# Patient Record
Sex: Male | Born: 1980 | Race: White | Hispanic: No | Marital: Single | Smoking: Current every day smoker
Health system: Southern US, Community
[De-identification: ages and names within clinical notes are randomized; demographics above are authoritative.]

## PROBLEM LIST (undated history)

## (undated) DIAGNOSIS — F1910 Other psychoactive substance abuse, uncomplicated: Secondary | ICD-10-CM

## (undated) DIAGNOSIS — R768 Other specified abnormal immunological findings in serum: Secondary | ICD-10-CM

## (undated) DIAGNOSIS — F1110 Opioid abuse, uncomplicated: Secondary | ICD-10-CM

## (undated) DIAGNOSIS — N200 Calculus of kidney: Secondary | ICD-10-CM

## (undated) DIAGNOSIS — Z992 Dependence on renal dialysis: Secondary | ICD-10-CM

---

## 2000-03-25 ENCOUNTER — Encounter: Admission: RE | Admit: 2000-03-25 | Discharge: 2000-03-25 | Payer: Self-pay | Admitting: Internal Medicine

## 2000-09-11 ENCOUNTER — Emergency Department (HOSPITAL_COMMUNITY): Admission: EM | Admit: 2000-09-11 | Discharge: 2000-09-11 | Payer: Self-pay | Admitting: Emergency Medicine

## 2000-09-11 ENCOUNTER — Encounter: Payer: Self-pay | Admitting: Emergency Medicine

## 2007-06-29 ENCOUNTER — Emergency Department (HOSPITAL_COMMUNITY): Admission: EM | Admit: 2007-06-29 | Discharge: 2007-06-29 | Payer: Self-pay | Admitting: Family Medicine

## 2011-04-29 LAB — COMPREHENSIVE METABOLIC PANEL
ALT: 24
AST: 44 — ABNORMAL HIGH
Albumin: 3.6
Alkaline Phosphatase: 65
BUN: 8
Chloride: 103
Potassium: 4.1
Sodium: 136
Total Bilirubin: 0.8
Total Protein: 6

## 2011-04-29 LAB — URINALYSIS, ROUTINE W REFLEX MICROSCOPIC
Bilirubin Urine: NEGATIVE
Glucose, UA: NEGATIVE
Hgb urine dipstick: NEGATIVE
Ketones, ur: NEGATIVE
Nitrite: NEGATIVE
Protein, ur: NEGATIVE
Specific Gravity, Urine: 1.024
Urobilinogen, UA: 1
pH: 6.5

## 2011-04-29 LAB — CBC
HCT: 37.6 — ABNORMAL LOW
Hemoglobin: 13.4
Platelets: 259
RDW: 12.7
WBC: 8.2

## 2011-04-29 LAB — RAPID URINE DRUG SCREEN, HOSP PERFORMED
Amphetamines: NOT DETECTED
Barbiturates: NOT DETECTED
Benzodiazepines: POSITIVE — AB
Cocaine: NOT DETECTED
Opiates: POSITIVE — AB
Tetrahydrocannabinol: POSITIVE — AB

## 2011-04-29 LAB — DIFFERENTIAL
Basophils Absolute: 0
Basophils Relative: 0
Eosinophils Absolute: 0.2
Eosinophils Relative: 2
Monocytes Absolute: 0.8
Monocytes Relative: 10
Neutro Abs: 4.7

## 2011-04-29 LAB — ETHANOL: Alcohol, Ethyl (B): <5

## 2011-10-16 ENCOUNTER — Emergency Department (HOSPITAL_BASED_OUTPATIENT_CLINIC_OR_DEPARTMENT_OTHER)
Admission: EM | Admit: 2011-10-16 | Discharge: 2011-10-16 | Disposition: A | Discharge status: Home / Self Care | Payer: Self-pay | Attending: Emergency Medicine | Admitting: Emergency Medicine

## 2011-10-16 ENCOUNTER — Encounter (HOSPITAL_BASED_OUTPATIENT_CLINIC_OR_DEPARTMENT_OTHER): Payer: Self-pay

## 2011-10-16 ENCOUNTER — Emergency Department (INDEPENDENT_AMBULATORY_CARE_PROVIDER_SITE_OTHER): Payer: Self-pay

## 2011-10-16 DIAGNOSIS — R109 Unspecified abdominal pain: Secondary | ICD-10-CM

## 2011-10-16 DIAGNOSIS — M542 Cervicalgia: Secondary | ICD-10-CM | POA: Insufficient documentation

## 2011-10-16 DIAGNOSIS — M549 Dorsalgia, unspecified: Secondary | ICD-10-CM

## 2011-10-16 DIAGNOSIS — R079 Chest pain, unspecified: Secondary | ICD-10-CM

## 2011-10-16 DIAGNOSIS — M546 Pain in thoracic spine: Secondary | ICD-10-CM | POA: Insufficient documentation

## 2011-10-16 DIAGNOSIS — R0789 Other chest pain: Secondary | ICD-10-CM

## 2011-10-16 DIAGNOSIS — R071 Chest pain on breathing: Secondary | ICD-10-CM | POA: Insufficient documentation

## 2011-10-16 DIAGNOSIS — S161XXA Strain of muscle, fascia and tendon at neck level, initial encounter: Secondary | ICD-10-CM

## 2011-10-16 DIAGNOSIS — S139XXA Sprain of joints and ligaments of unspecified parts of neck, initial encounter: Secondary | ICD-10-CM | POA: Insufficient documentation

## 2011-10-16 HISTORY — DX: Calculus of kidney: N20.0

## 2011-10-16 HISTORY — DX: Dependence on renal dialysis: Z99.2

## 2011-10-16 MED ORDER — IBUPROFEN 800 MG PO TABS
800.0000 mg | ORAL_TABLET | Freq: Once | ORAL | Status: AC
  Administered 2011-10-16: 800 mg via ORAL
  Filled 2011-10-16: qty 1

## 2011-10-16 MED ORDER — IBUPROFEN 600 MG PO TABS: 600.0000 mg | ORAL_TABLET | Freq: Four times a day (QID) | ORAL | Status: AC | PRN

## 2011-10-16 MED ORDER — OXYCODONE-ACETAMINOPHEN 5-325 MG PO TABS
2.0000 | ORAL_TABLET | Freq: Once | ORAL | Status: AC
  Administered 2011-10-16: 2 via ORAL
  Filled 2011-10-16: qty 2

## 2011-10-16 MED ORDER — OXYCODONE-ACETAMINOPHEN 5-325 MG PO TABS: 1.0000 | ORAL_TABLET | ORAL | Status: AC | PRN

## 2011-10-16 NOTE — Discharge Instructions (Signed)
Cervical Sprain A cervical sprain is an injury in the neck in which the ligaments are stretched or torn. The ligaments are the tissues that hold the bones of the neck (vertebrae) in place.Cervical sprains can range from very mild to very severe. Most cervical sprains get better in 1 to 3 weeks, but it depends on the cause and extent of the injury. Severe cervical sprains can cause the neck vertebrae to be unstable. This can lead to damage of the spinal cord and can result in serious nervous system problems. Your caregiver will determine whether your cervical sprain is mild or severe. CAUSES  Severe cervical sprains may be caused by:  Contact sport injuries (football, rugby, wrestling, hockey, auto racing, gymnastics, diving, martial arts, boxing).   Motor vehicle collisions.   Whiplash injuries. This means the neck is forcefully whipped backward and forward.   Falls.  Mild cervical sprains may be caused by:   Awkward positions, such as cradling a telephone between your ear and shoulder.   Sitting in a chair that does not offer proper support.   Working at a poorly designed computer station.   Activities that require looking up or down for long periods of time.  SYMPTOMS   Pain, soreness, stiffness, or a burning sensation in the front, back, or sides of the neck. This discomfort may develop immediately after injury or it may develop slowly and not begin for 24 hours or more after an injury.   Pain or tenderness directly in the middle of the back of the neck.   Shoulder or upper back pain.   Limited ability to move the neck.   Headache.   Dizziness.   Weakness, numbness, or tingling in the hands or arms.   Muscle spasms.   Difficulty swallowing or chewing.   Tenderness and swelling of the neck.  DIAGNOSIS  Most of the time, your caregiver can diagnose this problem by taking your history and doing a physical exam. Your caregiver will ask about any known problems, such as  arthritis in the neck or a previous neck injury. X-rays may be taken to find out if there are any other problems, such as problems with the bones of the neck. However, an X-ray often does not reveal the full extent of a cervical sprain. Other tests such as a computed tomography (CT) scan or magnetic resonance imaging (MRI) may be needed. TREATMENT  Treatment depends on the severity of the cervical sprain. Mild sprains can be treated with rest, keeping the neck in place (immobilization), and pain medicines. Severe cervical sprains need immediate immobilization and an appointment with an orthopedist or neurosurgeon. Several treatment options are available to help with pain, muscle spasms, and other symptoms. Your caregiver may prescribe:  Medicines, such as pain relievers, numbing medicines, or muscle relaxants.   Physical therapy. This can include stretching exercises, strengthening exercises, and posture training. Exercises and improved posture can help stabilize the neck, strengthen muscles, and help stop symptoms from returning.   A neck collar to be worn for short periods of time. Often, these collars are worn for comfort. However, certain collars may be worn to protect the neck and prevent further worsening of a serious cervical sprain.  HOME CARE INSTRUCTIONS   Put ice on the injured area.   Put ice in a plastic bag.   Place a towel between your skin and the bag.   Leave the ice on for 15 to 20 minutes, 3 to 4 times a day.     Only take over-the-counter or prescription medicines for pain, discomfort, or fever as directed by your caregiver.   Keep all follow-up appointments as directed by your caregiver.   Keep all physical therapy appointments as directed by your caregiver.   If a neck collar is prescribed, wear it as directed by your caregiver.   Do not drive while wearing a neck collar.   Make any needed adjustments to your work station to promote good posture.   Avoid positions  and activities that make your symptoms worse.   Warm up and stretch before being active to help prevent problems.  SEEK MEDICAL CARE IF:   Your pain is not controlled with medicine.   You are unable to decrease your pain medicine over time as planned.   Your activity level is not improving as expected.  SEEK IMMEDIATE MEDICAL CARE IF:   You develop any bleeding, stomach upset, or signs of an allergic reaction to your medicine.   Your symptoms get worse.   You develop new, unexplained symptoms.   You have numbness, tingling, weakness, or paralysis in any part of your body.  MAKE SURE YOU:   Understand these instructions.   Will watch your condition.   Will get help right away if you are not doing well or get worse.  Document Released: 05/05/2007 Document Revised: 06/27/2011 Document Reviewed: 04/10/2011 [facility] Patient Information 2012 [place], [place].  You have neck pain, possibly from a cervical strain and/or pinched nerve.   SEEK IMMEDIATE MEDICAL ATTENTION IF: You develop difficulties swallowing or breathing.  You have new or worse numbness, weakness, tingling, or movement problems in your arms or legs.  You develop increasing pain which is uncontrolled with medications.  You have change in bowel or bladder function, or other concerns.
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
## 2013-09-07 ENCOUNTER — Emergency Department (HOSPITAL_COMMUNITY): Payer: Self-pay | Admitting: Anesthesiology

## 2013-09-07 ENCOUNTER — Emergency Department (HOSPITAL_BASED_OUTPATIENT_CLINIC_OR_DEPARTMENT_OTHER): Payer: Self-pay

## 2013-09-07 ENCOUNTER — Encounter (HOSPITAL_COMMUNITY): Payer: Self-pay | Admitting: Anesthesiology

## 2013-09-07 ENCOUNTER — Encounter (HOSPITAL_COMMUNITY)
Admission: EM | Disposition: A | Discharge status: Home / Self Care | Payer: Self-pay | Source: Home / Self Care | Attending: Orthopedic Surgery

## 2013-09-07 ENCOUNTER — Encounter (HOSPITAL_BASED_OUTPATIENT_CLINIC_OR_DEPARTMENT_OTHER): Payer: Self-pay | Admitting: Emergency Medicine

## 2013-09-07 ENCOUNTER — Ambulatory Visit: Admit: 2013-09-07 | Payer: Self-pay | Admitting: Orthopedic Surgery

## 2013-09-07 ENCOUNTER — Inpatient Hospital Stay (HOSPITAL_BASED_OUTPATIENT_CLINIC_OR_DEPARTMENT_OTHER)
Admission: EM | Admit: 2013-09-07 | Discharge: 2013-09-10 | DRG: 908 | Disposition: A | Discharge status: Home / Self Care | Payer: Self-pay | Attending: Orthopedic Surgery | Admitting: Orthopedic Surgery

## 2013-09-07 DIAGNOSIS — F1110 Opioid abuse, uncomplicated: Secondary | ICD-10-CM | POA: Diagnosis present

## 2013-09-07 DIAGNOSIS — T798XXA Other early complications of trauma, initial encounter: Principal | ICD-10-CM | POA: Diagnosis present

## 2013-09-07 DIAGNOSIS — L02414 Cutaneous abscess of left upper limb: Secondary | ICD-10-CM

## 2013-09-07 DIAGNOSIS — IMO0002 Reserved for concepts with insufficient information to code with codable children: Secondary | ICD-10-CM | POA: Diagnosis present

## 2013-09-07 DIAGNOSIS — F1210 Cannabis abuse, uncomplicated: Secondary | ICD-10-CM | POA: Diagnosis present

## 2013-09-07 DIAGNOSIS — F17200 Nicotine dependence, unspecified, uncomplicated: Secondary | ICD-10-CM | POA: Diagnosis present

## 2013-09-07 DIAGNOSIS — Y939 Activity, unspecified: Secondary | ICD-10-CM

## 2013-09-07 HISTORY — PX: I&D EXTREMITY: SHX5045

## 2013-09-07 LAB — COMPREHENSIVE METABOLIC PANEL
ALT: 16 U/L (ref 0–53)
AST: 45 U/L — ABNORMAL HIGH (ref 0–37)
Albumin: 3.9 g/dL (ref 3.5–5.2)
Alkaline Phosphatase: 128 U/L — ABNORMAL HIGH (ref 39–117)
BILIRUBIN TOTAL: 0.5 mg/dL (ref 0.3–1.2)
BUN: 6 mg/dL (ref 6–23)
CALCIUM: 9.6 mg/dL (ref 8.4–10.5)
CO2: 24 meq/L (ref 19–32)
CREATININE: 0.8 mg/dL (ref 0.50–1.35)
Chloride: 101 mEq/L (ref 96–112)
GLUCOSE: 115 mg/dL — AB (ref 70–99)
Potassium: 4.1 mEq/L (ref 3.7–5.3)
SODIUM: 137 meq/L (ref 137–147)
Total Protein: 7.5 g/dL (ref 6.0–8.3)

## 2013-09-07 LAB — CBC WITH DIFFERENTIAL/PLATELET
Basophils Absolute: 0 10*3/uL (ref 0.0–0.1)
Basophils Relative: 0 % (ref 0–1)
EOS PCT: 1 % (ref 0–5)
Eosinophils Absolute: 0.1 10*3/uL (ref 0.0–0.7)
HEMATOCRIT: 41.5 % (ref 39.0–52.0)
Hemoglobin: 14.2 g/dL (ref 13.0–17.0)
LYMPHS ABS: 1.7 10*3/uL (ref 0.7–4.0)
LYMPHS PCT: 13 % (ref 12–46)
MCH: 32.8 pg (ref 26.0–34.0)
MCHC: 34.2 g/dL (ref 30.0–36.0)
MCV: 95.8 fL (ref 78.0–100.0)
MONO ABS: 1.4 10*3/uL — AB (ref 0.1–1.0)
Monocytes Relative: 11 % (ref 3–12)
Neutro Abs: 9.6 10*3/uL — ABNORMAL HIGH (ref 1.7–7.7)
Neutrophils Relative %: 74 % (ref 43–77)
Platelets: 215 10*3/uL (ref 150–400)
RBC: 4.33 MIL/uL (ref 4.22–5.81)
RDW: 14 % (ref 11.5–15.5)
WBC: 12.9 10*3/uL — AB (ref 4.0–10.5)

## 2013-09-07 SURGERY — IRRIGATION AND DEBRIDEMENT EXTREMITY
Anesthesia: General | Site: Arm Lower | Laterality: Left

## 2013-09-07 MED ORDER — PROPOFOL 10 MG/ML IV BOLUS
INTRAVENOUS | Status: DC | PRN
Start: 2013-09-07 — End: 2013-09-07
  Administered 2013-09-07: 200 mg via INTRAVENOUS

## 2013-09-07 MED ORDER — FAMOTIDINE 20 MG PO TABS
20.0000 mg | ORAL_TABLET | Freq: Two times a day (BID) | ORAL | Status: DC | PRN
  Filled 2013-09-07: qty 1

## 2013-09-07 MED ORDER — ONDANSETRON HCL 4 MG/2ML IJ SOLN
INTRAMUSCULAR | Status: DC | PRN
  Administered 2013-09-07: 4 mg via INTRAVENOUS

## 2013-09-07 MED ORDER — SODIUM CHLORIDE 0.9 % IV SOLN
Freq: Once | INTRAVENOUS | Status: AC
  Administered 2013-09-07: 16:00:00 via INTRAVENOUS

## 2013-09-07 MED ORDER — MORPHINE SULFATE 2 MG/ML IJ SOLN
1.0000 mg | INTRAMUSCULAR | Status: DC | PRN
  Administered 2013-09-08 – 2013-09-10 (×7): 1 mg via INTRAVENOUS
  Filled 2013-09-07 (×7): qty 1

## 2013-09-07 MED ORDER — VANCOMYCIN HCL IN DEXTROSE 1-5 GM/200ML-% IV SOLN
1000.0000 mg | Freq: Once | INTRAVENOUS | Status: AC
  Administered 2013-09-07: 1000 mg via INTRAVENOUS
  Filled 2013-09-07: qty 200

## 2013-09-07 MED ORDER — OXYCODONE HCL 5 MG/5ML PO SOLN: 5.0000 mg | Freq: Once | ORAL | Status: AC | PRN

## 2013-09-07 MED ORDER — OXYCODONE HCL 5 MG PO TABS
ORAL_TABLET | ORAL | Status: AC
  Filled 2013-09-07: qty 1

## 2013-09-07 MED ORDER — OXYCODONE HCL 5 MG PO TABS
5.0000 mg | ORAL_TABLET | ORAL | Status: DC | PRN
  Administered 2013-09-08: 10 mg via ORAL
  Administered 2013-09-08: 5 mg via ORAL
  Administered 2013-09-08 – 2013-09-10 (×15): 10 mg via ORAL
  Filled 2013-09-07 (×16): qty 2
  Filled 2013-09-07: qty 1

## 2013-09-07 MED ORDER — HYDROMORPHONE HCL PF 1 MG/ML IJ SOLN
1.0000 mg | Freq: Once | INTRAMUSCULAR | Status: AC
  Administered 2013-09-07: 1 mg via INTRAVENOUS
  Filled 2013-09-07: qty 1

## 2013-09-07 MED ORDER — PIPERACILLIN-TAZOBACTAM 3.375 G IVPB
3.3750 g | Freq: Three times a day (TID) | INTRAVENOUS | Status: DC
  Administered 2013-09-07 – 2013-09-10 (×9): 3.375 g via INTRAVENOUS
  Filled 2013-09-07 (×12): qty 50

## 2013-09-07 MED ORDER — DOCUSATE SODIUM 100 MG PO CAPS
100.0000 mg | ORAL_CAPSULE | Freq: Two times a day (BID) | ORAL | Status: DC
  Administered 2013-09-07 – 2013-09-10 (×6): 100 mg via ORAL
  Filled 2013-09-07 (×7): qty 1

## 2013-09-07 MED ORDER — FENTANYL CITRATE 0.05 MG/ML IJ SOLN
INTRAMUSCULAR | Status: DC | PRN
  Administered 2013-09-07 (×2): 100 ug via INTRAVENOUS
  Administered 2013-09-07: 50 ug via INTRAVENOUS

## 2013-09-07 MED ORDER — MIDAZOLAM HCL 2 MG/2ML IJ SOLN: 0.5000 mg | Freq: Once | INTRAMUSCULAR | Status: DC | PRN

## 2013-09-07 MED ORDER — NICOTINE 14 MG/24HR TD PT24
14.0000 mg | MEDICATED_PATCH | TRANSDERMAL | Status: DC
  Administered 2013-09-07 – 2013-09-09 (×3): 14 mg via TRANSDERMAL
  Filled 2013-09-07 (×4): qty 1

## 2013-09-07 MED ORDER — OXYCODONE HCL 5 MG PO TABS
5.0000 mg | ORAL_TABLET | Freq: Once | ORAL | Status: AC | PRN
  Administered 2013-09-07: 5 mg via ORAL

## 2013-09-07 MED ORDER — LACTATED RINGERS IV SOLN
INTRAVENOUS | Status: DC | PRN
  Administered 2013-09-07 (×2): via INTRAVENOUS

## 2013-09-07 MED ORDER — ONDANSETRON HCL 4 MG PO TABS
4.0000 mg | ORAL_TABLET | Freq: Four times a day (QID) | ORAL | Status: DC | PRN
Start: 2013-09-07 — End: 2013-09-10

## 2013-09-07 MED ORDER — ONDANSETRON HCL 4 MG/2ML IJ SOLN: 4.0000 mg | Freq: Four times a day (QID) | INTRAMUSCULAR | Status: DC | PRN

## 2013-09-07 MED ORDER — PROMETHAZINE HCL 25 MG/ML IJ SOLN: 6.2500 mg | INTRAMUSCULAR | Status: DC | PRN

## 2013-09-07 MED ORDER — HYDROMORPHONE HCL PF 1 MG/ML IJ SOLN: 0.2500 mg | INTRAMUSCULAR | Status: DC | PRN

## 2013-09-07 MED ORDER — VITAMIN C 500 MG PO TABS
1000.0000 mg | ORAL_TABLET | Freq: Every day | ORAL | Status: DC
  Administered 2013-09-07 – 2013-09-10 (×4): 1000 mg via ORAL
  Filled 2013-09-07 (×4): qty 2

## 2013-09-07 MED ORDER — LIDOCAINE HCL (CARDIAC) 20 MG/ML IV SOLN
INTRAVENOUS | Status: DC | PRN
  Administered 2013-09-07: 20 mg via INTRAVENOUS

## 2013-09-07 MED ORDER — ONDANSETRON HCL 4 MG/2ML IJ SOLN
4.0000 mg | Freq: Once | INTRAMUSCULAR | Status: AC
  Administered 2013-09-07: 4 mg via INTRAMUSCULAR
  Filled 2013-09-07: qty 2

## 2013-09-07 MED ORDER — SODIUM CHLORIDE 0.9 % IR SOLN
Status: DC | PRN
  Administered 2013-09-07: 3000 mL

## 2013-09-07 MED ORDER — CEFTRIAXONE SODIUM 1 G IJ SOLR
INTRAMUSCULAR | Status: AC
  Administered 2013-09-07: 1000 mg
  Filled 2013-09-07: qty 10

## 2013-09-07 MED ORDER — MEPERIDINE HCL 25 MG/ML IJ SOLN: 6.2500 mg | INTRAMUSCULAR | Status: DC | PRN

## 2013-09-07 MED ORDER — SUCCINYLCHOLINE CHLORIDE 20 MG/ML IJ SOLN
INTRAMUSCULAR | Status: DC | PRN
  Administered 2013-09-07: 140 mg via INTRAVENOUS

## 2013-09-07 MED ORDER — PROMETHAZINE HCL 25 MG RE SUPP: 12.5000 mg | Freq: Four times a day (QID) | RECTAL | Status: DC | PRN

## 2013-09-07 MED ORDER — MIDAZOLAM HCL 2 MG/2ML IJ SOLN
INTRAMUSCULAR | Status: DC | PRN
  Administered 2013-09-07: 2 mg via INTRAVENOUS

## 2013-09-07 MED ORDER — METHOCARBAMOL 500 MG PO TABS
ORAL_TABLET | ORAL | Status: AC
  Filled 2013-09-07: qty 1

## 2013-09-07 MED ORDER — METHOCARBAMOL 500 MG PO TABS
500.0000 mg | ORAL_TABLET | Freq: Four times a day (QID) | ORAL | Status: DC | PRN
Start: 2013-09-07 — End: 2013-09-10
  Administered 2013-09-07 – 2013-09-10 (×5): 500 mg via ORAL
  Filled 2013-09-07 (×5): qty 1

## 2013-09-07 MED ORDER — VANCOMYCIN HCL IN DEXTROSE 1-5 GM/200ML-% IV SOLN
1000.0000 mg | Freq: Three times a day (TID) | INTRAVENOUS | Status: DC
  Administered 2013-09-08 – 2013-09-10 (×8): 1000 mg via INTRAVENOUS
  Filled 2013-09-07 (×11): qty 200

## 2013-09-07 MED ORDER — DEXTROSE 5 % IV SOLN: 1.0000 g | INTRAVENOUS | Status: DC

## 2013-09-07 MED ORDER — LACTATED RINGERS IV SOLN
INTRAVENOUS | Status: DC
  Administered 2013-09-07 – 2013-09-09 (×2): via INTRAVENOUS

## 2013-09-07 MED ORDER — METHOCARBAMOL 100 MG/ML IJ SOLN
500.0000 mg | Freq: Four times a day (QID) | INTRAMUSCULAR | Status: DC | PRN
  Filled 2013-09-07: qty 5

## 2013-09-07 MED ORDER — HYDROMORPHONE HCL PF 1 MG/ML IJ SOLN
INTRAMUSCULAR | Status: AC
  Filled 2013-09-07: qty 1

## 2013-09-07 SURGICAL SUPPLY — 48 items
BANDAGE CONFORM 2  STR LF (GAUZE/BANDAGES/DRESSINGS) IMPLANT
BANDAGE ELASTIC 4 VELCRO ST LF (GAUZE/BANDAGES/DRESSINGS) ×3 IMPLANT
BANDAGE GAUZE ELAST BULKY 4 IN (GAUZE/BANDAGES/DRESSINGS) ×3 IMPLANT
BNDG GAUZE ELAST 4 BULKY (GAUZE/BANDAGES/DRESSINGS) ×2 IMPLANT
CLOTH BEACON ORANGE TIMEOUT ST (SAFETY) ×3 IMPLANT
CORDS BIPOLAR (ELECTRODE) ×3 IMPLANT
CUFF TOURNIQUET SINGLE 18IN (TOURNIQUET CUFF) ×3 IMPLANT
CUFF TOURNIQUET SINGLE 24IN (TOURNIQUET CUFF) IMPLANT
DRSG ADAPTIC 3X8 NADH LF (GAUZE/BANDAGES/DRESSINGS) ×3 IMPLANT
ELECT REM PT RETURN 9FT ADLT (ELECTROSURGICAL) ×3
ELECTRODE REM PT RTRN 9FT ADLT (ELECTROSURGICAL) IMPLANT
GAUZE PACKING IODOFORM 1 (PACKING) ×2 IMPLANT
GAUZE XEROFORM 1X8 LF (GAUZE/BANDAGES/DRESSINGS) ×3 IMPLANT
GAUZE XEROFORM 5X9 LF (GAUZE/BANDAGES/DRESSINGS) ×2 IMPLANT
GLOVE BIO SURGEON STRL SZ 6.5 (GLOVE) ×2 IMPLANT
GLOVE BIO SURGEONS STRL SZ 6.5 (GLOVE) ×1
GLOVE BIOGEL M STRL SZ7.5 (GLOVE) ×3 IMPLANT
GLOVE BIOGEL PI IND STRL 6.5 (GLOVE) IMPLANT
GLOVE BIOGEL PI INDICATOR 6.5 (GLOVE) ×2
GLOVE SS BIOGEL STRL SZ 8 (GLOVE) ×1 IMPLANT
GLOVE SUPERSENSE BIOGEL SZ 8 (GLOVE) ×4
GOWN SRG XL XLNG 56XLVL 4 (GOWN DISPOSABLE) ×3 IMPLANT
GOWN STRL NON-REIN LRG LVL3 (GOWN DISPOSABLE) ×5 IMPLANT
GOWN STRL NON-REIN XL XLG LVL4 (GOWN DISPOSABLE) ×3
HANDPIECE INTERPULSE COAX TIP (DISPOSABLE)
KIT BASIN OR (CUSTOM PROCEDURE TRAY) ×3 IMPLANT
KIT ROOM TURNOVER OR (KITS) ×3 IMPLANT
MANIFOLD NEPTUNE II (INSTRUMENTS) ×3 IMPLANT
NDL HYPO 25GX1X1/2 BEV (NEEDLE) IMPLANT
NEEDLE HYPO 25GX1X1/2 BEV (NEEDLE) IMPLANT
NS IRRIG 1000ML POUR BTL (IV SOLUTION) ×3 IMPLANT
PACK ORTHO EXTREMITY (CUSTOM PROCEDURE TRAY) ×3 IMPLANT
PAD ARMBOARD 7.5X6 YLW CONV (MISCELLANEOUS) ×6 IMPLANT
PAD CAST 4YDX4 CTTN HI CHSV (CAST SUPPLIES) ×2 IMPLANT
PADDING CAST COTTON 4X4 STRL (CAST SUPPLIES) ×6
SET HNDPC FAN SPRY TIP SCT (DISPOSABLE) IMPLANT
SPONGE GAUZE 4X4 12PLY (GAUZE/BANDAGES/DRESSINGS) ×3 IMPLANT
SPONGE LAP 18X18 X RAY DECT (DISPOSABLE) ×3 IMPLANT
SPONGE LAP 4X18 X RAY DECT (DISPOSABLE) ×1 IMPLANT
SUT SILK 3 0 TIES 10X30 (SUTURE) ×2 IMPLANT
SYR CONTROL 10ML LL (SYRINGE) IMPLANT
TOWEL OR 17X24 6PK STRL BLUE (TOWEL DISPOSABLE) ×3 IMPLANT
TOWEL OR 17X26 10 PK STRL BLUE (TOWEL DISPOSABLE) ×3 IMPLANT
TUBE ANAEROBIC SPECIMEN COL (MISCELLANEOUS) ×2 IMPLANT
TUBE CONNECTING 12'X1/4 (SUCTIONS) ×1
TUBE CONNECTING 12X1/4 (SUCTIONS) ×2 IMPLANT
WATER STERILE IRR 1000ML POUR (IV SOLUTION) ×3 IMPLANT
YANKAUER SUCT BULB TIP NO VENT (SUCTIONS) ×3 IMPLANT

## 2013-09-07 NOTE — Anesthesia Preprocedure Evaluation (Addendum)
Anesthesia Evaluation  Patient identified by MRN, date of birth, ID band Patient awake    Reviewed: Allergy & Precautions, H&P , NPO status , Patient's Chart, lab work & pertinent test results  History of Anesthesia Complications Negative for: history of anesthetic complications  Airway Mallampati: I TM Distance: >3 FB Neck ROM: Full    Dental  (+) Poor Dentition, Chipped, Missing, Dental Advisory Given   Pulmonary Current Smoker,  breath sounds clear to auscultation  Pulmonary exam normal       Cardiovascular negative cardio ROS  Rhythm:Regular Rate:Normal     Neuro/Psych Anxiety    GI/Hepatic negative GI ROS, (+)     substance abuse  alcohol use, cocaine use, marijuana use and IV drug use, Elevated LFTs   Endo/Other    Renal/GU negative Renal ROS     Musculoskeletal   Abdominal   Peds  Hematology negative hematology ROS (+)   Anesthesia Other Findings   Reproductive/Obstetrics                         Anesthesia Physical Anesthesia Plan  ASA: III and emergent  Anesthesia Plan: General   Post-op Pain Management:    Induction: Intravenous and Rapid sequence  Airway Management Planned: Oral ETT  Additional Equipment:   Intra-op Plan:   Post-operative Plan: Extubation in OR  Informed Consent: I have reviewed the patients History and Physical, chart, labs and discussed the procedure including the risks, benefits and alternatives for the proposed anesthesia with the patient or authorized representative who has indicated his/her understanding and acceptance.   Dental advisory given  Plan Discussed with: Surgeon and CRNA  Anesthesia Plan Comments: (Plan routine monitors, GETA)        Anesthesia Quick Evaluation

## 2013-09-07 NOTE — ED Provider Notes (Signed)
CSN: [id number]     Arrival date & time 09/07/13  1301 History   First MD Initiated Contact with Patient 09/07/13 1520     Chief Complaint  Patient presents with  . Abscess     (Consider location/radiation/quality/duration/timing/severity/associated sxs/prior Treatment) Patient is a 33 y.o. male presenting with abscess. The history is provided by the patient. No language interpreter was used.  Abscess Location:  Shoulder/arm Shoulder/arm abscess location:  L arm Size:  10 Abscess quality: induration and redness   Abscess quality: not draining   Progression:  Worsening Chronicity:  New Context: not diabetes   Relieved by:  Nothing Worsened by:  Nothing tried Ineffective treatments:  None tried Associated symptoms: fever   Associated symptoms: no nausea and no vomiting   Pt has a history of heroin abuse but denies injecting in area.   Pt thinks he was bitten by a spider  Past Medical History  Diagnosis Date  . Kidney stones   . Dialysis care    History reviewed. No pertinent past surgical history. No family history on file. History  Substance Use Topics  . Smoking status: Current Every Day Smoker  . Smokeless tobacco: Not on file  . Alcohol Use: Yes     Comment: 6 pack a week.      Review of Systems  Constitutional: Positive for fever.  Gastrointestinal: Negative for nausea and vomiting.  Musculoskeletal: Positive for joint swelling and myalgias.  All other systems reviewed and are negative.      Allergies  Review of patient's allergies indicates no known allergies.  Home Medications  No current outpatient prescriptions on file. BP 101/55  Pulse 116  Temp(Src) 98 F (36.7 C) (Oral)  Resp 20  Ht 5\' 10"  (1.778 m)  Wt 160 lb (72.576 kg)  BMI 22.96 kg/m2  SpO2 99% Physical Exam  Nursing note and vitals reviewed. Constitutional: He is oriented to person, place, and time. He appears well-developed and well-nourished.  HENT:  Head: Normocephalic and  atraumatic.  Right Ear: External ear normal.  Left Ear: External ear normal.  Nose: Nose normal.  Mouth/Throat: Oropharynx is clear and moist.  Eyes: Pupils are equal, round, and reactive to light.  Neck: Normal range of motion.  Cardiovascular: Normal rate and regular rhythm.   Pulmonary/Chest: Effort normal.  Musculoskeletal: He exhibits edema and tenderness.  Swollen red left forearm,  Large abscess,   Red streaks to left axilla   Neurological: He is alert and oriented to person, place, and time. He has normal reflexes.  Skin: There is erythema.  Psychiatric: He has a normal mood and affect.    ED Course  Procedures (including critical care time) Labs Review Labs Reviewed - No data to display Imaging Review No results found.  EKG Interpretation   None       MDM   Final diagnoses:  Abscess of left arm   Pt given iv ns, dilaudid, zofran , rocephin and vancomycin I spoke to Dr. Amanda Peagramig who request pt be transferred to shortstay to go to [place].     Elson AreasLeslie K Sofia, PA-C 09/07/13 1812  Lonia SkinnerLeslie K KansasSofia, PA-C 09/07/13 [phone]

## 2013-09-07 NOTE — Anesthesia Postprocedure Evaluation (Signed)
  Anesthesia Post-op Note  Patient: David [name]  Procedure(s) Performed: Procedure(s): IRRIGATION AND DEBRIDEMENT EXTREMITY (Left)  Patient Location: PACU  Anesthesia Type:General  Level of Consciousness: awake, alert , oriented and patient cooperative  Airway and Oxygen Therapy: Patient Spontanous Breathing and Patient connected to nasal cannula oxygen  Post-op Pain: mild  Post-op Assessment: Post-op Vital signs reviewed, Patient's Cardiovascular Status Stable, Respiratory Function Stable, Patent Airway, No signs of Nausea or vomiting and Pain level controlled  Post-op Vital Signs: Reviewed and stable  Complications: No apparent anesthesia complications

## 2013-09-07 NOTE — Op Note (Signed)
See dictation#[id number] Vladimir CroftsGramig M.D.
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
## 2013-09-07 NOTE — Progress Notes (Addendum)
ANTIBIOTIC CONSULT NOTE - INITIAL  Pharmacy Consult for Vanco/Zosyn Indication: Post-op hand surgery  No Known Allergies  Patient Measurements: Height: 5\' 10"  (177.8 cm) Weight: 160 lb (72.576 kg) IBW/kg (Calculated) : 73 Adjusted Body Weight:    Vital Signs: Temp: 98.9 F (37.2 C) (02/17 2115) Temp src: Oral (02/17 1305) BP: 137/76 mmHg (02/17 2115) Pulse Rate: 93 (02/17 2115) Intake/Output from previous day:   Intake/Output from this shift: Total I/O In: 1150 [P.O.:50; I.V.:1100] Out: -   Labs:  Recent Labs  09/07/13 1545  WBC 12.9*  HGB 14.2  PLT 215  CREATININE 0.80   Estimated Creatinine Clearance: 136.1 ml/min (by C-G formula based on Cr of 0.8). No results found for this basename: VANCOTROUGH, VANCOPEAK, VANCORANDOM, GENTTROUGH, GENTPEAK, GENTRANDOM, TOBRATROUGH, TOBRAPEAK, TOBRARND, AMIKACINPEAK, AMIKACINTROU, AMIKACIN,  in the last 72 hours   Microbiology: No results found for this or any previous visit (from the past 720 hour(s)).  Medical History: Past Medical History  Diagnosis Date  . Kidney stones   . Dialysis care     Medications:  No prescriptions prior to admission   Assessment: L forearm abscess 33 y/o M presents with abscess on L forearm from IV drug use. I&D performed 2/17 PM. Plan to start Vanco/Zosyn post-op  Afebrile. WBC 12.9. Scr 0.8  Goal of Therapy:  Vancomycin trough level 10-15 mcg/ml  Plan:  Vancomycin 1g IV q8hrs (last dose 1730) Trough after 3-5 doses at steady state. Zosyn 3.375g IV q8hr  Misty StanleyRobertson, Nathanyl Andujo Stillinger 09/07/2013,9:44 PM
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
## 2013-09-07 NOTE — Transfer of Care (Signed)
Immediate Anesthesia Transfer of Care Note  Patient: David [name]  Procedure(s) Performed: Procedure(s): IRRIGATION AND DEBRIDEMENT EXTREMITY (Left)  Patient Location: PACU  Anesthesia Type:General  Level of Consciousness: awake, sedated and patient cooperative  Airway & Oxygen Therapy: Patient Spontanous Breathing and Patient connected to nasal cannula oxygen  Post-op Assessment: Report given to PACU RN, Post -op Vital signs reviewed and stable and Patient moving all extremities X 4  Post vital signs: Reviewed and stable  Complications: No apparent anesthesia complications

## 2013-09-07 NOTE — Anesthesia Procedure Notes (Signed)
Procedure Name: Intubation Date/Time: 09/07/2013 7:53 PM Performed by: Molli HazardGORDON, Alonnie Bieker M Pre-anesthesia Checklist: Patient identified, Emergency Drugs available, Suction available and Patient being monitored Patient Re-evaluated:Patient Re-evaluated prior to inductionOxygen Delivery Method: Circle system utilized Preoxygenation: Pre-oxygenation with 100% oxygen Intubation Type: IV induction, Rapid sequence and Cricoid Pressure applied Laryngoscope Size: Miller and 2 Grade View: Grade I Tube type: Oral Tube size: 7.5 mm Number of attempts: 1 Airway Equipment and Method: Stylet Placement Confirmation: ETT inserted through vocal cords under direct vision,  positive ETCO2 and breath sounds checked- equal and bilateral Secured at: 23 cm Tube secured with: Tape Dental Injury: Teeth and Oropharynx as per pre-operative assessment

## 2013-09-08 LAB — CBC WITH DIFFERENTIAL/PLATELET
BASOS ABS: 0 10*3/uL (ref 0.0–0.1)
BASOS PCT: 0 % (ref 0–1)
Eosinophils Absolute: 0.2 10*3/uL (ref 0.0–0.7)
Eosinophils Relative: 2 % (ref 0–5)
HEMATOCRIT: 39.9 % (ref 39.0–52.0)
Hemoglobin: 13.8 g/dL (ref 13.0–17.0)
Lymphocytes Relative: 16 % (ref 12–46)
Lymphs Abs: 1.9 10*3/uL (ref 0.7–4.0)
MCH: 32.9 pg (ref 26.0–34.0)
MCHC: 34.6 g/dL (ref 30.0–36.0)
MCV: 95.2 fL (ref 78.0–100.0)
Monocytes Absolute: 1.5 10*3/uL — ABNORMAL HIGH (ref 0.1–1.0)
Monocytes Relative: 13 % — ABNORMAL HIGH (ref 3–12)
NEUTROS ABS: 8 10*3/uL — AB (ref 1.7–7.7)
Neutrophils Relative %: 69 % (ref 43–77)
Platelets: 203 10*3/uL (ref 150–400)
RBC: 4.19 MIL/uL — ABNORMAL LOW (ref 4.22–5.81)
RDW: 14.2 % (ref 11.5–15.5)
WBC: 11.6 10*3/uL — AB (ref 4.0–10.5)

## 2013-09-08 LAB — HEPATITIS PANEL, ACUTE
HCV Ab: REACTIVE — AB
HEP B C IGM: NONREACTIVE
Hep A IgM: NONREACTIVE
Hepatitis B Surface Ag: NEGATIVE

## 2013-09-08 LAB — HIV ANTIBODY (ROUTINE TESTING W REFLEX): HIV: NONREACTIVE

## 2013-09-08 LAB — BASIC METABOLIC PANEL
BUN: 4 mg/dL — ABNORMAL LOW (ref 6–23)
CHLORIDE: 104 meq/L (ref 96–112)
CO2: 21 mEq/L (ref 19–32)
CREATININE: 0.67 mg/dL (ref 0.50–1.35)
Calcium: 9.2 mg/dL (ref 8.4–10.5)
GFR calc non Af Amer: >90 mL/min (ref >90)
Glucose, Bld: 93 mg/dL (ref 70–99)
Potassium: 3.9 mEq/L (ref 3.7–5.3)
Sodium: 137 mEq/L (ref 137–147)

## 2013-09-08 NOTE — ED Provider Notes (Signed)
33 y.o. Male with abscess left forearm x 2 days. With surrounding erythema almost circumferential of arm and spreading erythema proximally.  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  He does not report chest pain or dyspnea.  No murmur noted on exam.     I performed a history and physical examination of David [name] [name] and discussed his management with Ms. David [name].  I agree with the history, physical, assessment, and plan of care, with the following exceptions: None  I was present for the following procedures: None Time Spent in Critical Care of the patient: None Time spent in discussions with the patient and family: 10  Chayah Mckee Corlis LeakS    [employer] S Maria Coin, MD 09/08/13 1331

## 2013-09-08 NOTE — Progress Notes (Signed)
Patient ID: David [name], male   DOB: 1980/07/24, 33 y.o.   MRN: [id number]  Patient is alert and oriented. He states his pain is well controlled.  Physical exam shows that he is neurovascularly intact. He has less cellulitis proximally. He has no evidence of radial median or ulnar nerve dysfunction.  His white blood cell count is improved.  I discussed with him all issues. He has no signs of DVT no signs of worsening problems. We will plan for mission sling  I have ordered physical therapy for hydrotherapy purposes and wet-to-dry dressing changes        .Marland Kitchen.We have discussed with the patient the issues regarding their infection to the extremity. We will continue antibiotics and await culture results. Often times it will take 3-5 days for cultures to become final. During this time we will typically have patients on intravenous antibiotics until we can find a parenteral route of antibiotic regime specific for the bacteria or organism isolated. We have discussed with the patient the need for daily irrigation and debridement as well as therapy to the area. We have discussed with the patient the necessity of range of motion to the involved joints as discussed today. We have discussed with the patient the unpredictability of infections at times. We'll continue to work towards good pain control and restoration of function. The patient understands the need for meticulous wound care and the necessity of proper followup.  The possible complications of stiffness (loss of motion), resistant infection, possible deep bone infection, possible chronic pain issues, possible need for multiple surgeries and even amputation.  With this in mind the patient understands our goal is to eradicate the infection to quiesence. We will continue to work towards these goals.  David [name] M.D.

## 2013-09-08 NOTE — Progress Notes (Signed)
Physical Therapy Wound Treatment Patient Details  Name: David [name] MRN: [id number] Date of Birth: January 08, 1981  Today's Date: 09/08/2013 Time: 0927-1000 Time Calculation (min): 33 min  Subjective  Subjective: "I've never seen anything like this," pt stated about wound. Patient and Family Stated Goals: Get better. Date of Onset: 09/05/13 Prior Treatments: Surgical I&D  Pain Score: Pain Score: 8 Pt premedicated and repositioned.  Wound Assessment  Wound / Incision (Open or Dehisced) 09/07/13 Other (Comment) Arm Left;Mid red, edematous (Active)  Dressing Type Moist to dry;Gauze (Comment);ABD;Compression wrap 09/08/2013 10:18 AM  Dressing Changed Changed 09/08/2013 10:18 AM  Dressing Status Clean;Dry;Intact 09/08/2013 10:18 AM  Dressing Change Frequency Daily 09/08/2013 10:18 AM  Site / Wound Assessment Red;Bleeding 09/08/2013 10:18 AM  % Wound base Red or Granulating 100% 09/08/2013 10:18 AM  % Wound base Yellow 0% 09/08/2013 10:18 AM  % Wound base Black 0% 09/08/2013 10:18 AM  % Wound base Other (Comment) 0% 09/08/2013 10:18 AM  Peri-wound Assessment Edema;Erythema (blanchable);Induration 09/08/2013 10:18 AM  Wound Length (cm) 4 cm 09/08/2013 10:18 AM  Wound Width (cm) 2 cm 09/08/2013 10:18 AM  Wound Depth (cm) 1 cm 09/08/2013 10:18 AM  Undermining (cm) 2 cm at 6 o'clock 09/08/2013 10:18 AM  Margins Unattached edges (unapproximated) 09/08/2013 10:18 AM  Closure None 09/08/2013 10:18 AM  Drainage Amount Copious 09/08/2013 10:18 AM  Drainage Description Sanguineous 09/08/2013 10:18 AM  Treatment Hydrotherapy (Pulse lavage);Packing (Saline gauze) 09/08/2013 10:18 AM   Hydrotherapy Pulsed lavage therapy - wound location: lt forearm Pulsed Lavage with Suction (psi): 4 psi (4-8) Pulsed Lavage with Suction - Normal Saline Used: 1000 mL Pulsed Lavage Tip: Tip with splash shield   Wound Assessment and Plan  Wound Therapy - Assess/Plan/Recommendations Wound Therapy - Clinical Statement: Pt presents  to hydrotherapy with open arm wound after I&D of abscess. Can benefit from hydrotherapy to cleanse wound and promote healing. Wound Therapy - Functional Problem List: Decr use of lt arm. Factors Delaying/Impairing Wound Healing: Infection - systemic/local;Substance abuse Hydrotherapy Plan: Debridement;Dressing change;Patient/family education;Pulsatile lavage with suction Wound Therapy - Frequency: 6X / week Wound Therapy - Follow Up Recommendations: Other (comment) (per hand surgeon. Teaching pt wet to dry dressings.) Wound Plan: See above  Wound Therapy Goals- Improve the function of patient's integumentary system by progressing the wound(s) through the phases of wound healing (inflammation - proliferation - remodeling) by: Improve Drainage Characteristics: Mod Improve Drainage Characteristics - Progress: Goal set today Patient/Family will be able to : verbalize dressing changes for home. Patient/Family Instruction Goal - Progress: Goal set today Goals/treatment plan/discharge plan were made with and agreed upon by patient/family: Yes Time For Goal Achievement: 7 days Wound Therapy - Potential for Goals: Good  Goals will be updated until maximal potential achieved or discharge criteria met.  Discharge criteria: when goals achieved, discharge from hospital, MD decision/surgical intervention, no progress towards goals, refusal/missing three consecutive treatments without notification or medical reason.  [name] 09/08/2013, 10:28 AM  Suanne Marker PT [phone]

## 2013-09-08 NOTE — Evaluation (Signed)
Physical Therapy Evaluation Patient Details Name: David SchlatterRichard [name] MRN: [id number] DOB: Nov 25, 1980 Today's Date: 09/08/2013 Time: 0900-0910 PT Time Calculation (min): 10 min  PT Assessment / Plan / Recommendation History of Present Illness  pt presents with L UE infections s/p I+D.    Clinical Impression  Pt moving independently without further acute PT needs at this time.  Will defer UE education to Hydrotherapy and OT.  Will sign off.      PT Assessment  Patent does not need any further PT services    Follow Up Recommendations  No PT follow up    Does the patient have the potential to tolerate intense rehabilitation      Barriers to Discharge        Equipment Recommendations  None recommended by PT    Recommendations for Other Services     Frequency      Precautions / Restrictions Precautions Precautions: None Restrictions Weight Bearing Restrictions: No   Pertinent Vitals/Pain L UE 5/10 "when I move it".  Premedicated.        Mobility  Bed Mobility Overal bed mobility: Modified Independent General bed mobility comments: pt needs increased time to perform without using L UE.   Transfers Overall transfer level: Independent Equipment used: None Ambulation/Gait Ambulation/Gait assistance: Independent Ambulation Distance (Feet): 40 Feet Assistive device: None Gait Pattern/deviations: WFL(Within Functional Limits) General Gait Details: pt moving independently.  pt able to stand and don pants without difficulty.      Exercises     PT Diagnosis:    PT Problem List:   PT Treatment Interventions:       PT Goals(Current goals can be found in the care plan section)    Visit Information  Last PT Received On: 09/08/13 Assistance Needed: +1 History of Present Illness: pt presents with L UE infections s/p I+D.         Prior Functioning  Home Living Family/patient expects to be discharged to:: Private residence Living Arrangements: Parent;Other  relatives Prior Function Level of Independence: Independent Communication Communication: No difficulties    Cognition  Cognition Arousal/Alertness: Awake/alert Behavior During Therapy: WFL for tasks assessed/performed Overall Cognitive Status: Within Functional Limits for tasks assessed    Extremity/Trunk Assessment Upper Extremity Assessment Upper Extremity Assessment: Defer to OT evaluation Lower Extremity Assessment Lower Extremity Assessment: Overall WFL for tasks assessed Cervical / Trunk Assessment Cervical / Trunk Assessment: Normal   Balance Balance Overall balance assessment: Independent  End of Session PT - End of Session Activity Tolerance: Patient tolerated treatment well Patient left: in bed;with call bell/phone within reach Nurse Communication: Mobility status  [name], David [name], [place]PT [phone] 09/08/2013, 9:30 AM

## 2013-09-08 NOTE — Op Note (Signed)
NAMMarland Kitchen:  David [name], David [name]            ACCOUNT NO.:  [id number]  MEDICAL RECORD NO.:  [id number]  LOCATION:  [id number]                        FACILITY:  [facility]  PHYSICIAN:  Dionne AnoWilliam M. Meghin Thivierge, M.D.DATE OF BIRTH:  [birth year]  DATE OF PROCEDURE:  09/07/2013 DATE OF DISCHARGE:                              OPERATIVE REPORT   PREOPERATIVE DIAGNOSIS:  Abscess, left forearm with evolving compartment syndrome.  POSTOPERATIVE DIAGNOSIS:  Abscess, left forearm with evolving compartment syndrome.  PROCEDURE: 1. I and D, deep abscess, left forearm. 2. Extensive fasciotomy, left forearm. 3. Resection, 4 cm thrombosed vein, extensive in nature, left forearm. 4. I and D of skin, subcutaneous tissue, muscle, and fascia.  This was     an excisional debridement with knife, blade, curette, and scissors     tip.  SURGEON:  Dionne AnoWilliam M. Amanda PeaGramig, M.D.  ASSISTANT:  None.  COMPLICATIONS:  None.  ANESTHESIA:  General.  TOURNIQUET TIME:  Less than an hour.  DRAINS:  Large amount of iodoform packing was used.  INDICATIONS:  The patient is a 33 year old male referred in from [facility] with an infection and evolving compartment syndrome about the forearm.  The patient is a known IV drug abuser and recently used as the notes reflect.  He has an obvious infection with ascending cellulitis, deep abscess involving compartment syndrome.  He understands risks and benefits of surgery and desires to proceed.  PROCEDURE NOTE:  The patient was seen by myself and anesthesia, taken to the operative suite, underwent a smooth induction of general anesthetic [name].  Once this was done, a sterile field was secured.  Time-out called.  Pre and postop check was complete, and the patient underwent a 2 inch incision about the forearm, immediately encountered purulent material.  This was cultured for aerobic  and anaerobic cultures.  Following this, I very carefully set out to decompress the abscess, it was suctioned, this was done. Following this, it was very clear he would require fasciotomy as the fascia was very thick and inflamed.  At this juncture, I then entered the fascial region and performed an extensive fasciotomy.  Fortunately, the muscle looks fairly healthy.  Excised a large amount of fascia as it looked to be unusually inflamed.  Following extensive fasciotomy and removal of the fascia, I then performed identification of the thrombosed vein.  I have resected a 4 cm area and tied this off with silk ties.  Thus, extensive fasciotomy and fasciectomy were accomplished.  Following this, I resected a 4 cm area of thrombosed vein utilizing silk ties.  There was heroin in the lumen and obvious significant devitalized tissue as well as of a devitalized segment of the vein.  This was necrotic.  Following removal of this, I then performed additional irrigation and debridement of the muscle fascia and associated structures.  The abscess was previously decompressed and at this juncture, I then irrigated with 3 L of saline.  Cysto-tubing was used, saline was copiously irrigated.  Tourniquet was deflated, and the wound was checked multiple times.  Compartments were soft at this junction.  I was pleased, the muscles  did bulge and appeared to be healthy.  I then packed him with iodoform gauze.  After hemostasis was secured, Adaptic, Xeroform and fluffed gauze were then placed.  The patient tolerated this well.  There were no complicating features.  Following this, he was extubated and taken to recovery room.  We will admit him for IV antibiotics, general postop observatory care measures, and await cultures.  I have discussed with him all issues, do's and don'ts etc., and all questions have been encouraged and answered.     Dionne Ano. Amanda Pea, M.D.     [facility]  D:  09/07/2013  T:   09/08/2013  Job:  [id number]

## 2013-09-08 NOTE — Evaluation (Signed)
Occupational Therapy Evaluation Patient Details Name: David SchlatterRichard Bordenave MRN: [id number] DOB: 07/13/1981 Today's Date: 09/08/2013 Time: [id number] OT Time Calculation (min): 10 min  OT Assessment / Plan / Recommendation History of present illness pt presents with L UE infections s/p I+D.     Clinical Impression   Education provided to pt and he performed LUE exercises. Educated on elevating LUE, applying ice, and moving digits/joints.     OT Assessment  Patient does not need any further OT services    Follow Up Recommendations  No OT follow up    Barriers to Discharge      Equipment Recommendations  None recommended by OT    Recommendations for Other Services    Frequency       Precautions / Restrictions Precautions Precautions:  (elevation, edema control) Restrictions Weight Bearing Restrictions: No   Pertinent Vitals/Pain Pain 8/10. Nurse notified. LUE elevated on pillows.     ADL  Upper Body Dressing: Modified independent Where Assessed - Upper Body Dressing: Unsupported sitting Lower Body Dressing: Modified independent Where Assessed - Lower Body Dressing: Unsupported sit to stand Toilet Transfer: Independent Toilet Transfer Method: Sit to Barist[place] Toilet Transfer Equipment: Other (comment) (bed) Transfers/Ambulation Related to ADLs: Independent ADL Comments: Educated to elevate LUE as well as educated on HEP for LUE to prevent other joints from getting stiff. Told pt applying ice helps with edema. Performed approximately 10-15 reps of each exercise on LUE.    OT Diagnosis:    OT Problem List:   OT Treatment Interventions:     OT Goals(Current goals can be found in the care plan section)    Visit Information  Last OT Received On: 09/08/13 Assistance Needed: +1 History of Present Illness: pt presents with L UE infections s/p I+D.         Prior Functioning     Home Living Family/patient expects to be discharged to:: Private residence Living Arrangements:  Parent;Other relatives Prior Function Level of Independence: Independent Communication Communication: No difficulties Dominant Hand: Right         Vision/Perception     Cognition  Cognition Arousal/Alertness: Awake/alert Behavior During Therapy: WFL for tasks assessed/performed Overall Cognitive Status: Within Functional Limits for tasks assessed    Extremity/Trunk Assessment Upper Extremity Assessment Upper Extremity Assessment: LUE deficits/detail LUE Deficits / Details: I & D of forearm Lower Extremity Assessment Lower Extremity Assessment: Defer to PT evaluation     Mobility Bed Mobility Overal bed mobility: Modified Independent Transfers Overall transfer level: Independent Equipment used: None     Exercise General Exercises - Upper Extremity Shoulder Flexion: AROM;Left;10 reps;Seated Shoulder Extension: AROM;Left;10 reps;Seated Elbow Flexion: AROM;Left;10 reps;Seated Elbow Extension: AROM;Left;10 reps;Seated Wrist Flexion: AROM;Left;Seated;15 reps Wrist Extension: AROM;Left;15 reps;Seated Digit Composite Flexion: AROM;Left;10 reps;Seated Composite Extension: AROM;Left;10 reps;Seated Other Exercises Other Exercises: AROM supination/pronation-approximately 10-15 reps while sitting   Balance     End of Session OT - End of Session Activity Tolerance: Patient tolerated treatment well Patient left: in bed;with call bell/phone within reach Nurse Communication: Patient requests pain meds  GO     Earlie RavelingStraub, Jamesa Tedrick L OTR/L [phone] 09/08/2013, 1:59 PM

## 2013-09-08 NOTE — Progress Notes (Signed)
UR complete.  Wilburn Keir RN, MSN 

## 2013-09-08 NOTE — Progress Notes (Signed)
Orthopedic Tech Progress Note Patient Details:  Garth SchlatterRichard Wanzer 1981-05-17 [id number]  Ortho Devices Type of Ortho Device: Arm sling Ortho Device/Splint Location: Kuzma sling and arm sling Ortho Device/Splint Interventions: Application   Cammer, Mickie BailJennifer Carol 09/08/2013, 10:12 AM

## 2013-09-09 LAB — CBC WITH DIFFERENTIAL/PLATELET
Basophils Absolute: 0 10*3/uL (ref 0.0–0.1)
Basophils Relative: 0 % (ref 0–1)
Eosinophils Absolute: 0.3 10*3/uL (ref 0.0–0.7)
Eosinophils Relative: 4 % (ref 0–5)
HCT: 39.4 % (ref 39.0–52.0)
HEMOGLOBIN: 13.4 g/dL (ref 13.0–17.0)
LYMPHS ABS: 1.7 10*3/uL (ref 0.7–4.0)
Lymphocytes Relative: 26 % (ref 12–46)
MCH: 32.6 pg (ref 26.0–34.0)
MCHC: 34 g/dL (ref 30.0–36.0)
MCV: 95.9 fL (ref 78.0–100.0)
MONOS PCT: 16 % — AB (ref 3–12)
Monocytes Absolute: 1.1 10*3/uL — ABNORMAL HIGH (ref 0.1–1.0)
NEUTROS ABS: 3.6 10*3/uL (ref 1.7–7.7)
NEUTROS PCT: 54 % (ref 43–77)
PLATELETS: 220 10*3/uL (ref 150–400)
RBC: 4.11 MIL/uL — AB (ref 4.22–5.81)
RDW: 14.2 % (ref 11.5–15.5)
WBC: 6.7 10*3/uL (ref 4.0–10.5)

## 2013-09-09 LAB — BASIC METABOLIC PANEL
BUN: 4 mg/dL — AB (ref 6–23)
CHLORIDE: 103 meq/L (ref 96–112)
CO2: 24 mEq/L (ref 19–32)
Calcium: 9.3 mg/dL (ref 8.4–10.5)
Creatinine, Ser: 0.62 mg/dL (ref 0.50–1.35)
GFR calc non Af Amer: >90 mL/min (ref >90)
Glucose, Bld: 102 mg/dL — ABNORMAL HIGH (ref 70–99)
Potassium: 3.8 mEq/L (ref 3.7–5.3)
SODIUM: 140 meq/L (ref 137–147)

## 2013-09-09 NOTE — Progress Notes (Signed)
Physical Therapy Wound Treatment Patient Details  Name: David [name] MRN: [id number] Date of Birth: 06/17/1981  Today's Date: 09/09/2013 Time: 0854-0911 Time Calculation (min): 17 min  Subjective     Pain Score: Pain Score: Pt reports pain better today. Premedicated.  Wound Assessment  Wound / Incision (Open or Dehisced) 09/07/13 Other (Comment) Arm Left;Mid red, edematous (Active)  Dressing Type Moist to dry;Gauze (Comment);ABD;Compression wrap 09/09/2013  9:13 AM  Dressing Changed Changed 09/09/2013  9:13 AM  Dressing Status Clean;Dry;Intact 09/09/2013  9:13 AM  Dressing Change Frequency Daily 09/09/2013  9:13 AM  Site / Wound Assessment Red;Bleeding 09/09/2013  9:13 AM  % Wound base Red or Granulating 100% 09/09/2013  9:13 AM  % Wound base Yellow 0% 09/09/2013  9:13 AM  % Wound base Black 0% 09/09/2013  9:13 AM  % Wound base Other (Comment) 0% 09/09/2013  9:13 AM  Peri-wound Assessment Edema;Erythema (blanchable);Induration 09/09/2013  9:13 AM  Wound Length (cm) 4 cm 09/08/2013 10:18 AM  Wound Width (cm) 2 cm 09/08/2013 10:18 AM  Wound Depth (cm) 1 cm 09/08/2013 10:18 AM  Undermining (cm) 2 cm at 6 o'clock 09/08/2013 10:18 AM  Margins Unattached edges (unapproximated) 09/09/2013  9:13 AM  Closure None 09/09/2013  9:13 AM  Drainage Amount Minimal 09/09/2013  9:13 AM  Drainage Description Serosanguineous 09/09/2013  9:13 AM  Treatment Hydrotherapy (Pulse lavage);Packing (Saline gauze) 09/09/2013  9:13 AM   Hydrotherapy Pulsed lavage therapy - wound location: lt forearm Pulsed Lavage with Suction (psi): 4 psi (4-8) Pulsed Lavage with Suction - Normal Saline Used: 1000 mL Pulsed Lavage Tip: Tip with splash shield   Wound Assessment and Plan  Wound Therapy - Assess/Plan/Recommendations Wound Therapy - Clinical Statement: Wound continues to look good. Beefy red granulation tissue present throughout.  Pt verbalized understanding of dressing changes at home. Hydrotherapy Plan:  Debridement;Dressing change;Patient/family education;Pulsatile lavage with suction Wound Therapy - Frequency: 6X / week Wound Therapy - Follow Up Recommendations: Other (comment) (wet to dry dressing changes at home) Wound Plan: See above  Wound Therapy Goals- Improve the function of patient's integumentary system by progressing the wound(s) through the phases of wound healing (inflammation - proliferation - remodeling) by: Improve Drainage Characteristics - Progress: Met Patient/Family Instruction Goal - Progress: Met  Goals will be updated until maximal potential achieved or discharge criteria met.  Discharge criteria: when goals achieved, discharge from hospital, MD decision/surgical intervention, no progress towards goals, refusal/missing three consecutive treatments without notification or medical reason.  GP     MAYCOCK,CARY 09/09/2013, 9:16 AM  Cary Maycock PT [phone]    

## 2013-09-09 NOTE — Progress Notes (Signed)
Patient ID: [name], male   DOB: [birth year], 33 y.o.   MRN: [id number] Patient seen at bedside.  Patient has improved markedly.  The patient is less cellulitis improved range of motion improved sensation and no evidence of compartment syndrome that is present at this juncture.  The patient has cultures which are showing gram-positive cocci. We are awaiting the final cultures.  We will continue vancomycin and Zosyn.  Procedure note: Patient underwent I&D of skin subcutaneous tissue and muscle. This was an excisional debridement of skin subcutaneous and muscle with scissor curet and knife blade without difficulty. He was copiously lavaged throughout the wound. Following this he underwent a wet-to-dry dressing change without difficulty.  He tolerated procedure bedside nicely.  He is doing well in terms of by mouth intake. He feels capable of performing home dressing changes.  I've casting regards to heroin addiction and other issues as there germane to his upper extremity predicament.  The a patient overall looks much improved and is likely to transition to outpatient wound care and observatory measures tomorrow or Saturday.  It was a pleasure to see him   .Marland Kitchen.We have discussed with the patient the issues regarding their infection to the extremity. We will continue antibiotics and await culture results. Often times it will take 3-5 days for cultures to become final. During this time we will typically have patients on intravenous antibiotics until we can find a parenteral route of antibiotic regime specific for the bacteria or organism isolated. We have discussed with the patient the need for daily irrigation and debridement as well as therapy to the area. We have discussed with the patient the necessity of range of motion to the involved joints as discussed today. We have discussed with the patient the unpredictability of infections at times. We'll continue to work towards good pain control  and restoration of function. The patient understands the need for meticulous wound care and the necessity of proper followup.  The possible complications of stiffness (loss of motion), resistant infection, possible deep bone infection, possible chronic pain issues, possible need for multiple surgeries and even amputation.  With this in mind the patient understands our goal is to eradicate the infection to quiesence. We will continue to work towards these goals.  [name] M.D.

## 2013-09-09 NOTE — Progress Notes (Signed)
Pt A&O x4, IV remains intact and infusing, Dsg to Left arm dry and intact, report called off to McKessonSharica RN on 5N at 1030. Transporting pt out on bed with his belongings at side.

## 2013-09-10 ENCOUNTER — Encounter (HOSPITAL_COMMUNITY): Payer: Self-pay | Admitting: Orthopedic Surgery

## 2013-09-10 LAB — VANCOMYCIN, TROUGH: Vancomycin Tr: 17.7 ug/mL (ref 10.0–20.0)

## 2013-09-10 LAB — BASIC METABOLIC PANEL
BUN: 4 mg/dL — ABNORMAL LOW (ref 6–23)
CHLORIDE: 105 meq/L (ref 96–112)
CO2: 26 meq/L (ref 19–32)
CREATININE: 0.82 mg/dL (ref 0.50–1.35)
Calcium: 9.6 mg/dL (ref 8.4–10.5)
GFR calc Af Amer: >90 mL/min (ref >90)
GFR calc non Af Amer: >90 mL/min (ref >90)
GLUCOSE: 117 mg/dL — AB (ref 70–99)
POTASSIUM: 4.2 meq/L (ref 3.7–5.3)
Sodium: 143 mEq/L (ref 137–147)

## 2013-09-10 LAB — CBC WITH DIFFERENTIAL/PLATELET
BASOS PCT: 0 % (ref 0–1)
Basophils Absolute: 0 10*3/uL (ref 0.0–0.1)
Eosinophils Absolute: 0.3 10*3/uL (ref 0.0–0.7)
Eosinophils Relative: 5 % (ref 0–5)
HEMATOCRIT: 40.5 % (ref 39.0–52.0)
HEMOGLOBIN: 13.7 g/dL (ref 13.0–17.0)
LYMPHS PCT: 26 % (ref 12–46)
Lymphs Abs: 1.9 10*3/uL (ref 0.7–4.0)
MCH: 32.5 pg (ref 26.0–34.0)
MCHC: 33.8 g/dL (ref 30.0–36.0)
MCV: 96 fL (ref 78.0–100.0)
MONOS PCT: 14 % — AB (ref 3–12)
Monocytes Absolute: 1.1 10*3/uL — ABNORMAL HIGH (ref 0.1–1.0)
NEUTROS PCT: 55 % (ref 43–77)
Neutro Abs: 4.1 10*3/uL (ref 1.7–7.7)
Platelets: 253 10*3/uL (ref 150–400)
RBC: 4.22 MIL/uL (ref 4.22–5.81)
RDW: 14.2 % (ref 11.5–15.5)
WBC: 7.5 10*3/uL (ref 4.0–10.5)

## 2013-09-10 LAB — CULTURE, ROUTINE-ABSCESS

## 2013-09-10 MED ORDER — OXYCODONE HCL 5 MG PO TABS: 5.0000 mg | ORAL_TABLET | ORAL | Status: DC | PRN

## 2013-09-10 MED ORDER — ASCORBIC ACID 1000 MG PO TABS: 1000.0000 mg | ORAL_TABLET | Freq: Every day | ORAL | Status: DC

## 2013-09-10 MED ORDER — AMOXICILLIN 500 MG PO CAPS: 500.0000 mg | ORAL_CAPSULE | Freq: Three times a day (TID) | ORAL | Status: DC

## 2013-09-10 NOTE — Progress Notes (Signed)
Physical Therapy Wound Treatment Patient Details  Name: David [name] MRN: [id number] Date of Birth: [birth year]  Today's Date: 09/10/2013 Time: 6759-1638 Time Calculation (min): 23 min  Subjective  Subjective: Pt states he may get to go home today.  Pain Score: Pain Score: Pt premedicated  Wound Assessment  Wound / Incision (Open or Dehisced) 09/07/13 Other (Comment) Arm Left;Mid red, edematous (Active)  Dressing Type Moist to dry;Gauze (Comment);ABD;Compression wrap 09/10/2013  8:54 AM  Dressing Changed Changed 09/10/2013  8:54 AM  Dressing Status Clean;Dry;Intact 09/10/2013  8:54 AM  Dressing Change Frequency Daily 09/10/2013  8:54 AM  Site / Wound Assessment Red 09/10/2013  8:54 AM  % Wound base Red or Granulating 100% 09/10/2013  8:54 AM  % Wound base Yellow 0% 09/10/2013  8:54 AM  % Wound base Black 0% 09/10/2013  8:54 AM  % Wound base Other (Comment) 0% 09/10/2013  8:54 AM  Peri-wound Assessment Edema;Erythema (blanchable) 09/10/2013  8:54 AM  Wound Length (cm) 4 cm 09/08/2013 10:18 AM  Wound Width (cm) 2 cm 09/08/2013 10:18 AM  Wound Depth (cm) 1 cm 09/08/2013 10:18 AM  Undermining (cm) 2 cm at 6 o'clock 09/08/2013 10:18 AM  Margins Unattached edges (unapproximated) 09/10/2013  8:54 AM  Closure None 09/10/2013  8:54 AM  Drainage Amount Minimal 09/10/2013  8:54 AM  Drainage Description Serosanguineous 09/10/2013  8:54 AM  Treatment Hydrotherapy (Pulse lavage);Packing (Saline gauze) 09/10/2013  8:54 AM   Hydrotherapy Pulsed lavage therapy - wound location: lt forearm Pulsed Lavage with Suction (psi): 4 psi (4-8) Pulsed Lavage with Suction - Normal Saline Used: 1000 mL Pulsed Lavage Tip: Tip with splash shield   Wound Assessment and Plan  Wound Therapy - Assess/Plan/Recommendations Wound Therapy - Clinical Statement: Wound continues to look good. Beefy red granulation tissue present throughout.  Pt verbalized understanding of dressing changes at home.  Hydrotherapy Plan:  Debridement;Dressing change;Patient/family education;Pulsatile lavage with suction Wound Therapy - Frequency: 6X / week Wound Therapy - Follow Up Recommendations: Other (comment) (dressing changes at home). May benefit from hydrogel at home to keep wound bed moist. Wound Plan: See above  Wound Therapy Goals- Improve the function of patient's integumentary system by progressing the wound(s) through the phases of wound healing (inflammation - proliferation - remodeling) by: Decrease Length/Width/Depth by (cm): Depth by .2 Improve Drainage Characteristics - Progress: Met Patient/Family Instruction Goal - Progress: Met  Goals will be updated until maximal potential achieved or discharge criteria met.  Discharge criteria: when goals achieved, discharge from hospital, MD decision/surgical intervention, no progress towards goals, refusal/missing three consecutive treatments without notification or medical reason.  [name] 09/10/2013, 9:01 AM  David [name] PT [phone]

## 2013-09-10 NOTE — Discharge Instructions (Signed)
Please elevate and move your fingers frequently. Please change her bandage daily with a wet-to-dry dressing change as instructed by the hospital and Dr. Amanda PeaGramig  Call to see Dr. Amanda PeaGramig in 5-6 days  Keep bandage clean and dry.  Call for any problems.  No smoking.  Criteria for driving a car: you should be off your pain medicine for 7-8 hours, able to drive one handed(confident), thinking clearly and feeling able in your judgement to drive. Continue elevation as it will decrease swelling.  If instructed by MD move your fingers within the confines of the bandage/splint.  Use ice if instructed by your MD. Call immediately for any sudden loss of feeling in your hand/arm or change in functional abilities of the extremity.   We recommend that you to take vitamin C 1000 mg a day to promote healing we also recommend that if you require her pain medicine that he take a stool softener to prevent constipation as most pain medicines will have constipation side effects. We recommend either Peri-Colace or Senokot and recommend that you also consider adding MiraLAX to prevent the constipation affects from pain medicine if you are required to use them. These medicines are over the counter and maybe purchased at a local pharmacy.

## 2013-09-10 NOTE — Progress Notes (Signed)
ANTIBIOTIC CONSULT NOTE - FOLLOW UP  Pharmacy Consult for vancomycin Indication: deep L forearm abscess  No Known Allergies  Patient Measurements: Height: 5\' 10"  (177.8 cm) Weight: 160 lb (72.576 kg) IBW/kg (Calculated) : 73  Vital Signs: Temp: 98 F (36.7 C) (02/20 1000) Temp src: Oral (02/20 1000) BP: 120/63 mmHg (02/20 1000) Pulse Rate: 78 (02/20 1000) Intake/Output from previous day: 02/19 0701 - 02/20 0700 In: 3348.7 [P.O.:1710; I.V.:1188.7; IV Piggyback:450] Out: 1050 [Urine:1050] Intake/Output from this shift: Total I/O In: 480 [P.O.:480] Out: 200 [Urine:200]  Labs:  Recent Labs  09/08/13 0645 09/09/13 0740 09/10/13 0425  WBC 11.6* 6.7 7.5  HGB 13.8 13.4 13.7  PLT 203 220 253  CREATININE 0.67 0.62 0.82   Estimated Creatinine Clearance: 132.8 ml/min (by C-G formula based on Cr of 0.82).  Recent Labs  09/10/13 0901  VANCOTROUGH 17.7     Microbiology: Recent Results (from the past 720 hour(s))  AFB CULTURE WITH SMEAR     Status: None   Collection Time    09/07/13  8:08 PM      Result Value Ref Range Status   Specimen Description ABSCESS LEFT ARM   Final   Special Requests NONE   Final   ACID FAST SMEAR     Final   Value: NO ACID FAST BACILLI SEEN     Performed at [facility]   Culture     Final   Value: CULTURE WILL BE EXAMINED FOR 6 WEEKS BEFORE ISSUING A FINAL REPORT     Performed at [facility]   Report Status PENDING   Incomplete  ANAEROBIC CULTURE     Status: None   Collection Time    09/07/13  8:08 PM      Result Value Ref Range Status   Specimen Description ABSCESS LEFT ARM   Final   Special Requests NONE   Final   Gram Stain     Final   Value: RARE WBC PRESENT, PREDOMINANTLY PMN     NO SQUAMOUS EPITHELIAL CELLS SEEN     FEW GRAM POSITIVE COCCI IN PAIRS     Performed at [facility]   Culture     Final   Value: NO ANAEROBES ISOLATED; CULTURE IN PROGRESS FOR 5 DAYS     Performed at [facility]   Report Status PENDING   Incomplete  CULTURE, ROUTINE-ABSCESS     Status: None   Collection Time    09/07/13  8:08 PM      Result Value Ref Range Status   Specimen Description ABSCESS LEFT ARM   Final   Special Requests NONE   Final   Gram Stain     Final   Value: RARE WBC PRESENT, PREDOMINANTLY PMN     NO SQUAMOUS EPITHELIAL CELLS SEEN     FEW GRAM POSITIVE COCCI IN PAIRS     Performed at [facility]   Culture     Final   Value: MODERATE MICROAEROPHILIC STREPTOCOCCI     Performed at [facility]   Report Status 09/10/2013 FINAL   Final  FUNGUS CULTURE W SMEAR     Status: None   Collection Time    09/07/13  8:08 PM      Result Value Ref Range Status   Specimen Description ABSCESS LEFT ARM   Final   Special Requests NONE   Final   Fungal Smear     Final   Value: NO YEAST OR FUNGAL ELEMENTS SEEN  Performed at [facility]   Culture     Final   Value: CULTURE IN PROGRESS FOR FOUR WEEKS     Performed at [facility]   Report Status PENDING   Incomplete    Anti-infectives   Start     Dose/Rate Route Frequency Ordered Stop   09/08/13 0130  vancomycin (VANCOCIN) IVPB 1000 mg/200 mL premix     1,000 mg 200 mL/hr over 60 Minutes Intravenous Every 8 hours 09/07/13 2150     09/07/13 2200  piperacillin-tazobactam (ZOSYN) IVPB 3.375 g     3.375 g 12.5 mL/hr over 240 Minutes Intravenous 3 times per day 09/07/13 2150     09/07/13 1555  cefTRIAXone (ROCEPHIN) 1 G injection    Comments:  Lonna DuvalBooth, Karen   : cabinet override      09/07/13 1555 09/07/13 1602   09/07/13 1545  vancomycin (VANCOCIN) IVPB 1000 mg/200 mL premix     1,000 mg 200 mL/hr over 60 Minutes Intravenous  Once 09/07/13 1530 09/07/13 1831   09/07/13 1545  cefTRIAXone (ROCEPHIN) 1 g in dextrose 5 % 50 mL IVPB  Status:  Discontinued     1 g 100 mL/hr over 30 Minutes Intravenous Every 24 hours 09/07/13 1530 09/07/13 2138      Assessment: 33 y/o male with deep abscess on L forearm from IV  drug use s/p I&D performed on 2/17. He continues on vancomycin with a trough of 17.7 today. I am changing to trough goal to 15-20 mcg/ml for abscess. His trough is therapeutic. Renal function is stable. Abscess culture is growing microaerophilic Strep.  Goal of Therapy:  Vancomycin trough level 15-20 mcg/ml  Plan:  -Continue vancomycin 1000 mg IV q8h -Consider narrowing to ceftriaxone if IV antibiotics are preferred; could use doxycycline or cefuroxime if PO preferred -Monitor renal function and clinical course  [facility] At Bethes[facility] WestJennifer Millsap, 17[address] Rainbow BoulevardPharm.D., BCPS Clinical Pharmacist Pager: [phone] 09/10/2013 10:42 AM

## 2013-09-10 NOTE — Discharge Summary (Addendum)
Physician Discharge Summary  Patient ID: Garth SchlatterRichard Ambrose MRN: [id number] DOB/AGE: 01-17-1981 32 y.o.  Admit date: 09/07/2013 Discharge date: 09/10/2013  Admission Diagnoses: Infection left forearm status post IV drug abuse Discharge Diagnoses: Same Active Problems:   Heroin abuse   Discharged Conditifair240}  Hospital Course: Patient was admitted postop for IV antibiotics and wound care. He had a large abscess left forearm secondary to IV heroin abuse. He grew out strep cocci which was sensitive to penicillin. He will be discharged home today.  His hospital course was unremarkable with excellent improvement in his wound parameters with the proper care. The patient is awake alert and oriented at time of discharge  We will check his Hepatitis C panal and F/u with Dr Ninetta LightsHatcher of ID- see my note and the labs    Consults: None  Significant Diagnostic Studies: labs: See chart  Treatments: surgery: See op note  Discharge Exam: Blood pressure 125/60, pulse 85, temperature 98 F (36.7 C), temperature source Oral, resp. rate 20, height 5\' 10"  (1.778 m), weight 72.576 kg (160 lb), SpO2 100.00%. General appearance: alert, cooperative and appears stated age .Marland Kitchen.The patient is alert and oriented in no acute distress the patient complains of pain in the affected upper extremity.  The patient is noted to have a normal HEENT exam.  Lung fields show equal chest expansion and no shortness of breath  abdomen exam is nontender without distention.  Lower extremity examination does not show any fracture dislocation or blood clot symptoms.  Pelvis is stable neck and back are stable and nontender  Disposition: 01-Home or Self Care     Medication List    Notice   You have not been prescribed any medications.       SignedKaren Chafe: Jonquil Stubbe III,Lafonda Patron M 09/10/2013, 4:06 PM

## 2013-09-10 NOTE — Progress Notes (Signed)
Patient ID: [name], male   DOB: [birth year], 33 y.o.   MRN: [id number] Patient is ready for dc D/w patient the positive hepatits C labs- Hep A and B and HIV are negative Discussed with Dr Ninetta LightsHatcher of ID Will draw Hep C rna qual lab and follow up in the ID clinic in a week Patient is aware David Malone MD

## 2013-09-12 LAB — ANAEROBIC CULTURE

## 2013-09-13 LAB — HEPATITIS C VRS RNA DETECT BY PCR-QUAL: Hepatitis C Vrs RNA by PCR-Qual: POSITIVE — AB

## 2013-10-06 LAB — FUNGUS CULTURE W SMEAR: Fungal Smear: NONE SEEN

## 2013-10-20 LAB — AFB CULTURE WITH SMEAR (NOT AT ARMC): ACID FAST SMEAR: NONE SEEN

## 2016-04-15 ENCOUNTER — Emergency Department (HOSPITAL_BASED_OUTPATIENT_CLINIC_OR_DEPARTMENT_OTHER)
Admission: EM | Admit: 2016-04-15 | Discharge: 2016-04-16 | Disposition: A | Discharge status: Home / Self Care | Payer: Self-pay | Attending: Emergency Medicine | Admitting: Emergency Medicine

## 2016-04-15 DIAGNOSIS — L02416 Cutaneous abscess of left lower limb: Secondary | ICD-10-CM | POA: Insufficient documentation

## 2016-04-15 DIAGNOSIS — L0291 Cutaneous abscess, unspecified: Secondary | ICD-10-CM

## 2016-04-15 DIAGNOSIS — F17200 Nicotine dependence, unspecified, uncomplicated: Secondary | ICD-10-CM | POA: Insufficient documentation

## 2016-04-15 NOTE — ED Triage Notes (Signed)
Pt. Has a red area with a scabbed over area on the L hip.  Pt. Constantly talking in triage and reports he is in pain.  Pt. Reports the area has been there for 2 days,

## 2016-04-16 MED ORDER — LIDOCAINE HCL 2 % IJ SOLN
10.0000 mL | Freq: Once | INTRAMUSCULAR | Status: AC
  Administered 2016-04-16: 200 mg
  Filled 2016-04-16: qty 20

## 2016-04-16 MED ORDER — SULFAMETHOXAZOLE-TRIMETHOPRIM 800-160 MG PO TABS: 1.0000 | ORAL_TABLET | Freq: Two times a day (BID) | ORAL | 0 refills | Status: AC

## 2016-04-16 MED ORDER — CEPHALEXIN 500 MG PO CAPS: 500.0000 mg | ORAL_CAPSULE | Freq: Four times a day (QID) | ORAL | 0 refills | Status: DC

## 2016-04-16 MED ORDER — CEPHALEXIN 250 MG PO CAPS
500.0000 mg | ORAL_CAPSULE | Freq: Once | ORAL | Status: AC
  Administered 2016-04-16: 500 mg via ORAL
  Filled 2016-04-16: qty 2

## 2016-04-16 MED ORDER — SULFAMETHOXAZOLE-TRIMETHOPRIM 800-160 MG PO TABS
1.0000 | ORAL_TABLET | Freq: Once | ORAL | Status: AC
  Administered 2016-04-16: 1 via ORAL
  Filled 2016-04-16: qty 1

## 2016-04-16 NOTE — Discharge Instructions (Signed)
Please read attached information. If you experience any new or worsening signs or symptoms please return to the emergency room for evaluation. Please follow-up with your primary care provider or specialist as discussed. Please use medication prescribed only as directed and discontinue taking if you have any concerning signs or symptoms.   °

## 2016-04-16 NOTE — ED Provider Notes (Signed)
[facility] Note   CSN: [id number] Arrival date & time: 04/15/16  2106     History   Chief Complaint Chief Complaint  Patient presents with  . Abscess    HPI David [name] is a 35 y.o. male.  HPI   35 year old male presents today with abscess to his left hip.  Patient had symptoms started several days ago as a small abscess that spread.  He notes discharge yesterday.  Denies any fever or chills at home.  Patient denies any other areas of involvement today.  Patient has a significant past medical history apparently abuse with upper extremity abscess requiring hospitalization in IV antibiotics. Patient reports he had attempted popping knee abscess and was getting purulent drainage from this.  Past Medical History:  Diagnosis Date  . Dialysis care   . Kidney stones     Patient Active Problem List   Diagnosis Date Noted  . Heroin abuse 09/07/2013    Past Surgical History:  Procedure Laterality Date  . I&D EXTREMITY Left 09/07/2013   Procedure: IRRIGATION AND DEBRIDEMENT EXTREMITY;  Surgeon: Dominica SeverinWilliam Gramig, MD;  Location: [facility] OR;  Service: Orthopedics;  Laterality: Left;     Home Medications    Prior to Admission medications   Medication Sig Start Date End Date Taking? Authorizing Provider  amoxicillin (AMOXIL) 500 MG capsule Take 1 capsule (500 mg total) by mouth 3 (three) times daily. 09/10/13   Dominica SeverinWilliam Gramig, MD  cephALEXin (KEFLEX) 500 MG capsule Take 1 capsule (500 mg total) by mouth 4 (four) times daily. 04/16/16   Eyvonne MechanicJeffrey Hildy Nicholl, PA-C  oxyCODONE (OXY IR/ROXICODONE) 5 MG immediate release tablet Take 1-2 tablets (5-10 mg total) by mouth every 4 (four) hours as needed for moderate pain. 09/10/13   Dominica SeverinWilliam Gramig, MD  sulfamethoxazole-trimethoprim (BACTRIM DS,SEPTRA DS) 800-160 MG tablet Take 1 tablet by mouth 2 (two) times daily. 04/16/16 04/23/16  Eyvonne MechanicJeffrey Mya Suell, PA-C  vitamin C (VITAMIN C) 1000 MG tablet Take 1 tablet (1,000 mg total) by mouth  daily. 09/10/13   Dominica SeverinWilliam Gramig, MD    Family History No family history on file.  Social History Social History  Substance Use Topics  . Smoking status: Current Every Day Smoker  . Smokeless tobacco: Not on file  . Alcohol use Yes     Comment: 6 pack a week.       Allergies   Review of patient's allergies indicates no known allergies.   Review of Systems Review of Systems  All other systems reviewed and are negative.    Physical Exam Updated Vital Signs BP 114/74 (BP Location: Right Arm)   Pulse 104   Temp 98.6 F (37 C) (Oral)   Resp 19   Ht 5\' 10"  (1.778 m)   Wt 68 kg   SpO2 99%   BMI 21.52 kg/m   Physical Exam  Constitutional: He is oriented to person, place, and time. He appears well-developed and well-nourished.  HENT:  Head: Normocephalic and atraumatic.  Eyes: Conjunctivae are normal. Pupils are equal, round, and reactive to light. Right eye exhibits no discharge. Left eye exhibits no discharge. No scleral icterus.  Neck: Normal range of motion. No JVD present. No tracheal deviation present.  Pulmonary/Chest: Effort normal. No stridor.  Neurological: He is alert and oriented to person, place, and time. Coordination normal.  Skin:  2 cm area of induration with surrounding redness noted to left lateral hip full active range of motion of the hip  Psychiatric: He has a normal mood  and affect. His behavior is normal. Judgment and thought content normal.  Nursing note and vitals reviewed.    ED Treatments / Results  Labs (all labs ordered are listed, but only abnormal results are displayed) Labs Reviewed - No data to display  EKG  EKG Interpretation None       Radiology No results found.  Procedures Procedures (including critical care time)  INCISION AND DRAINAGE Performed by: Thermon Leyland Consent: Verbal consent obtained. Risks and benefits: risks, benefits and alternatives were discussed Type: abscess  Body area: Left  hip  Anesthesia: local infiltration  Incision was made with a scalpel.  Local anesthetic: lidocaine 2 % 0 epinephrine  Anesthetic total: 4 ml  Complexity: complex Blunt dissection to break up loculations  Drainage: purulent  Drainage amount: 1 cc  Packing material: 1/4 in iodoform gauze  Patient tolerance: Patient tolerated the procedure well with no immediate complications.    Medications Ordered in ED Medications  lidocaine (XYLOCAINE) 2 % (with pres) injection 200 mg (200 mg Infiltration Given by Other 04/16/16 0106)  sulfamethoxazole-trimethoprim (BACTRIM DS,SEPTRA DS) 800-160 MG per tablet 1 tablet (1 tablet Oral Given 04/16/16 0105)  cephALEXin (KEFLEX) capsule 500 mg (500 mg Oral Given 04/16/16 0105)     Initial Impression / Assessment and Plan / ED Course  I have reviewed the triage vital signs and the nursing notes.  Pertinent labs & imaging results that were available during my care of the patient were reviewed by me and considered in my medical decision making (see chart for details).  Clinical Course      Final Clinical Impressions(s) / ED Diagnoses   Final diagnoses:  Abscess    35 year old male presents today with an abscess to his left hip.  This appears superficial no deep space involvement, minor surrounding cellulitis.  Afebrile and nontoxic.  Patient sleeping in exam bed, waking up anxious.  Abscess drained here in the ED, given dose of antibiotics.  Patient discharged home on antibiotics.  Patient will be staying with his mother who will help administer antibiotics, and given strict preterm percussions, verbalized understanding and agreement baseline and no further questions or concerns time discharge  New Prescriptions New Prescriptions   CEPHALEXIN (KEFLEX) 500 MG CAPSULE    Take 1 capsule (500 mg total) by mouth 4 (four) times daily.   SULFAMETHOXAZOLE-TRIMETHOPRIM (BACTRIM DS,SEPTRA DS) 800-160 MG TABLET    Take 1 tablet by mouth 2 (two) times  daily.     Eyvonne Mechanic, PA-C 04/16/16 0147    Shon Baton, MD 04/16/16 [phone]

## 2016-07-07 ENCOUNTER — Emergency Department (HOSPITAL_BASED_OUTPATIENT_CLINIC_OR_DEPARTMENT_OTHER)
Admission: EM | Admit: 2016-07-07 | Discharge: 2016-07-07 | Disposition: A | Discharge status: Home / Self Care | Payer: Self-pay | Attending: Emergency Medicine | Admitting: Emergency Medicine

## 2016-07-07 ENCOUNTER — Encounter (HOSPITAL_BASED_OUTPATIENT_CLINIC_OR_DEPARTMENT_OTHER): Payer: Self-pay | Admitting: Emergency Medicine

## 2016-07-07 DIAGNOSIS — F17210 Nicotine dependence, cigarettes, uncomplicated: Secondary | ICD-10-CM | POA: Insufficient documentation

## 2016-07-07 DIAGNOSIS — Y9389 Activity, other specified: Secondary | ICD-10-CM | POA: Insufficient documentation

## 2016-07-07 DIAGNOSIS — Y929 Unspecified place or not applicable: Secondary | ICD-10-CM | POA: Insufficient documentation

## 2016-07-07 DIAGNOSIS — Z79899 Other long term (current) drug therapy: Secondary | ICD-10-CM | POA: Insufficient documentation

## 2016-07-07 DIAGNOSIS — Y999 Unspecified external cause status: Secondary | ICD-10-CM | POA: Insufficient documentation

## 2016-07-07 DIAGNOSIS — IMO0002 Reserved for concepts with insufficient information to code with codable children: Secondary | ICD-10-CM

## 2016-07-07 DIAGNOSIS — X58XXXA Exposure to other specified factors, initial encounter: Secondary | ICD-10-CM | POA: Insufficient documentation

## 2016-07-07 DIAGNOSIS — L03012 Cellulitis of left finger: Secondary | ICD-10-CM | POA: Insufficient documentation

## 2016-07-07 MED ORDER — SULFAMETHOXAZOLE-TRIMETHOPRIM 800-160 MG PO TABS
1.0000 | ORAL_TABLET | Freq: Once | ORAL | Status: AC
  Administered 2016-07-07: 1 via ORAL
  Filled 2016-07-07: qty 1

## 2016-07-07 MED ORDER — SULFAMETHOXAZOLE-TRIMETHOPRIM 800-160 MG PO TABS: 1.0000 | ORAL_TABLET | Freq: Two times a day (BID) | ORAL | 0 refills | Status: AC

## 2016-07-07 MED ORDER — IBUPROFEN 400 MG PO TABS
600.0000 mg | ORAL_TABLET | Freq: Once | ORAL | Status: AC
  Administered 2016-07-07: 600 mg via ORAL
  Filled 2016-07-07: qty 1

## 2016-07-07 MED ORDER — BUPIVACAINE HCL (PF) 0.5 % IJ SOLN
20.0000 mL | Freq: Once | INTRAMUSCULAR | Status: AC
Start: 2016-07-07 — End: 2016-07-07
  Administered 2016-07-07: 10 mL
  Filled 2016-07-07: qty 20

## 2016-07-07 MED ORDER — HYDROCODONE-ACETAMINOPHEN 5-325 MG PO TABS: 1.0000 | ORAL_TABLET | ORAL | 0 refills | Status: DC | PRN

## 2016-07-07 NOTE — ED Provider Notes (Signed)
[facility] Provider Note   CSN: [id number] Arrival date & time: 07/07/16  1731  By signing my name below, I, David [name], attest that this documentation has been prepared under the direction and in the presence of Jacalyn Lefevre, MD.  Electronically Signed: Octavia [name], ED Scribe. 07/07/16. 5:56 PM.   History   Chief Complaint Chief Complaint  Patient presents with  . forgein body in thumb    HPI Comments: David [name] is a 35 y.o. male who presents to the Emergency Department presenting with a moderate, gradual worsening, foreign body in his left thumb x 5 days. Pt has associated redness, swelling and pain to the area. pt states he was picked up a glass mirror and it broke in his hand. He states having glass shards in his thumb. Pt reports trying to remove the glass with tweezers, and had mild purulent drainage from the area. Pt denies having any allergies. There are no other complaints.   The history is provided by the patient. No language interpreter was used.    Past Medical History:  Diagnosis Date  . Dialysis care    no longer in dialysis  . Kidney stones     Patient Active Problem List   Diagnosis Date Noted  . Heroin abuse 09/07/2013    Past Surgical History:  Procedure Laterality Date  . I&D EXTREMITY Left 09/07/2013   Procedure: IRRIGATION AND DEBRIDEMENT EXTREMITY;  Surgeon: Dominica Severin, MD;  Location: [facility] OR;  Service: Orthopedics;  Laterality: Left;       Home Medications    Prior to Admission medications   Medication Sig Start Date End Date Taking? Authorizing Provider  amoxicillin (AMOXIL) 500 MG capsule Take 1 capsule (500 mg total) by mouth 3 (three) times daily. 09/10/13   Dominica Severin, MD  cephALEXin (KEFLEX) 500 MG capsule Take 1 capsule (500 mg total) by mouth 4 (four) times daily. 04/16/16   Eyvonne Mechanic, PA-C  HYDROcodone-acetaminophen (NORCO/VICODIN) 5-325 MG tablet Take 1 tablet by mouth every 4 (four) hours as  needed. 07/07/16   Jacalyn Lefevre, MD  oxyCODONE (OXY IR/ROXICODONE) 5 MG immediate release tablet Take 1-2 tablets (5-10 mg total) by mouth every 4 (four) hours as needed for moderate pain. 09/10/13   Dominica Severin, MD  sulfamethoxazole-trimethoprim (BACTRIM DS,SEPTRA DS) 800-160 MG tablet Take 1 tablet by mouth 2 (two) times daily. 07/07/16 07/14/16  Jacalyn Lefevre, MD  vitamin C (VITAMIN C) 1000 MG tablet Take 1 tablet (1,000 mg total) by mouth daily. 09/10/13   Dominica Severin, MD    Family History History reviewed. No pertinent family history.  Social History Social History  Substance Use Topics  . Smoking status: Current Every Day Smoker    Packs/day: 1.00    Types: Cigarettes  . Smokeless tobacco: Never Used  . Alcohol use Yes     Comment: denies 07/07/16     Allergies   Patient has no known allergies.   Review of Systems Review of Systems  A complete 10 system review of systems was obtained and all systems are negative except as noted in the HPI and PMH.    Physical Exam Updated Vital Signs BP 138/86 (BP Location: Right Arm)   Pulse 119   Temp 98.8 F (37.1 C) (Oral)   Resp 24   Ht 5\' 10"  (1.778 m)   Wt 145 lb (65.8 kg)   SpO2 100%   BMI 20.81 kg/m   Physical Exam  Constitutional: He is oriented to person, place, and  time. He appears well-developed and well-nourished.  HENT:  Head: Normocephalic and atraumatic.  Eyes: EOM are normal.  Neck: Normal range of motion.  Cardiovascular: Normal rate, regular rhythm, normal heart sounds and intact distal pulses.   Pulmonary/Chest: Effort normal and breath sounds normal. No respiratory distress.  Abdominal: Soft. He exhibits no distension. There is no tenderness.  Musculoskeletal: Normal range of motion. He exhibits edema and tenderness.  Left thumb w/erythema, swelling, and tenderness at distal end  Neurological: He is alert and oriented to person, place, and time.  Skin: Skin is warm and dry.  Psychiatric:  Judgment normal. His mood appears anxious.  Nursing note and vitals reviewed.    ED Treatments / Results   DIAGNOSTIC STUDIES: Oxygen Saturation is 100% on room air, normal by my interpretation.  COORDINATION OF CARE: 5:55 PM Discussed treatment plan with pt at bedside and pt agreed to plan.   Labs (all labs ordered are listed, but only abnormal results are displayed) Labs Reviewed - No data to display  EKG  EKG Interpretation None       Radiology No results found.  Procedures .Nerve Block Date/Time: 07/07/2016 6:10 PM Performed by: Jacalyn LefevreHAVILAND, Remona Boom Authorized by: Jacalyn LefevreHAVILAND, Daaiyah Baumert   Consent:    Consent obtained:  Verbal   Consent given by:  Patient   Risks discussed:  Allergic reaction and infection   Alternatives discussed:  No treatment Indications:    Indications:  Pain relief and procedural anesthesia Location:    Body area:  Upper extremity   Laterality:  Left Pre-procedure details:    Skin preparation:  2% chlorhexidine Procedure details (see MAR for exact dosages):    Block needle gauge:  25 G   Anesthetic injected:  Bupivacaine 0.5% w/o epi   Steroid injected:  None   Additive injected:  None   Injection procedure:  Anatomic landmarks identified, incremental injection, negative aspiration for blood, introduced needle and anatomic landmarks palpated   Paresthesia:  None Post-procedure details:    Dressing:  None   Outcome:  Anesthesia achieved   Patient tolerance of procedure:  Tolerated well, no immediate complications  .Marland Kitchen.Incision and Drainage Date/Time: 07/07/2016 6:30 PM Performed by: Jacalyn LefevreHAVILAND, Quintavia Rogstad Authorized by: Jacalyn LefevreHAVILAND, Zell Hylton   Consent:    Consent obtained:  Verbal   Risks discussed:  Bleeding, pain, incomplete drainage and infection   Alternatives discussed:  No treatment Location:    Type:  Abscess   Size:  3 cm   Location:  Upper extremity   Upper extremity location:  Finger   Finger location:  L thumb Pre-procedure details:     Skin preparation:  Betadine Anesthesia (see MAR for exact dosages):    Anesthesia method:  Nerve block Procedure type:    Complexity:  Simple Procedure details:    Incision types:  Stab incision   Scalpel blade:  11   Wound management:  Probed and deloculated   Drainage:  Purulent   Drainage amount:  Moderate   Wound treatment:  Wound left open   Packing materials:  None Post-procedure details:    Patient tolerance of procedure:  Tolerated well, no immediate complications   (including critical care time)  I do not feel any glass in skin.  I told patient that I may not be able to feel small pieces of glass.  He is told they usually work themselves out.   Medications Ordered in ED Medications  bupivacaine (MARCAINE) 0.5 % injection 20 mL (10 mLs Infiltration Given 07/07/16 1808)  sulfamethoxazole-trimethoprim (  BACTRIM DS,SEPTRA DS) 800-160 MG per tablet 1 tablet (1 tablet Oral Given 07/07/16 1807)  ibuprofen (ADVIL,MOTRIN) tablet 600 mg (600 mg Oral Given 07/07/16 1807)     Initial Impression / Assessment and Plan / ED Course  I have reviewed the triage vital signs and the nursing notes.  Pertinent labs & imaging results that were available during my care of the patient were reviewed by me and considered in my medical decision making (see chart for details).  Clinical Course     Pt given the number to Dr. Debby Budoley's office to f/u.  He knows to return if worse.  Final Clinical Impressions(s) / ED Diagnoses   Final diagnoses:  Felon   I personally performed the services described in this documentation, which was scribed in my presence. The recorded information has been reviewed and is accurate.  New Prescriptions New Prescriptions   HYDROCODONE-ACETAMINOPHEN (NORCO/VICODIN) 5-325 MG TABLET    Take 1 tablet by mouth every 4 (four) hours as needed.   SULFAMETHOXAZOLE-TRIMETHOPRIM (BACTRIM DS,SEPTRA DS) 800-160 MG TABLET    Take 1 tablet by mouth 2 (two) times daily.       Jacalyn LefevreJulie Stevie Charter, MD 07/07/16 [phone]

## 2016-07-07 NOTE — ED Triage Notes (Signed)
Patient reports he has glass shards in his thumb, states a mirror broke and when he was picking it up the glass got into his thumb on Wednesday . Thumb is red and swollen. Patient c/o pain is throbbing. Patient has attempted to remove the glass from his thumb with a straight pin.    Patient temp unable to obtain in triage as he was drinking soda, will need recheck.

## 2016-12-05 ENCOUNTER — Emergency Department (HOSPITAL_BASED_OUTPATIENT_CLINIC_OR_DEPARTMENT_OTHER): Payer: Self-pay

## 2016-12-05 ENCOUNTER — Inpatient Hospital Stay (HOSPITAL_COMMUNITY)
Admission: EM | Admit: 2016-12-05 | Discharge: 2016-12-09 | DRG: 493 | Disposition: A | Discharge status: Home / Self Care | Payer: Self-pay | Attending: Internal Medicine | Admitting: Internal Medicine

## 2016-12-05 ENCOUNTER — Emergency Department (HOSPITAL_BASED_OUTPATIENT_CLINIC_OR_DEPARTMENT_OTHER)
Admission: EM | Admit: 2016-12-05 | Discharge: 2016-12-05 | Disposition: A | Discharge status: Home / Self Care | Payer: Self-pay | Attending: Emergency Medicine | Admitting: Emergency Medicine

## 2016-12-05 ENCOUNTER — Encounter (HOSPITAL_BASED_OUTPATIENT_CLINIC_OR_DEPARTMENT_OTHER): Payer: Self-pay | Admitting: Emergency Medicine

## 2016-12-05 ENCOUNTER — Encounter (HOSPITAL_COMMUNITY): Payer: Self-pay

## 2016-12-05 DIAGNOSIS — M25471 Effusion, right ankle: Secondary | ICD-10-CM

## 2016-12-05 DIAGNOSIS — F17210 Nicotine dependence, cigarettes, uncomplicated: Secondary | ICD-10-CM | POA: Insufficient documentation

## 2016-12-05 DIAGNOSIS — F419 Anxiety disorder, unspecified: Secondary | ICD-10-CM | POA: Diagnosis present

## 2016-12-05 DIAGNOSIS — R451 Restlessness and agitation: Secondary | ICD-10-CM | POA: Diagnosis not present

## 2016-12-05 DIAGNOSIS — F1910 Other psychoactive substance abuse, uncomplicated: Secondary | ICD-10-CM | POA: Diagnosis present

## 2016-12-05 DIAGNOSIS — F1110 Opioid abuse, uncomplicated: Secondary | ICD-10-CM | POA: Diagnosis present

## 2016-12-05 DIAGNOSIS — M00871 Arthritis due to other bacteria, right ankle and foot: Principal | ICD-10-CM | POA: Diagnosis present

## 2016-12-05 DIAGNOSIS — M65871 Other synovitis and tenosynovitis, right ankle and foot: Secondary | ICD-10-CM | POA: Diagnosis present

## 2016-12-05 DIAGNOSIS — D649 Anemia, unspecified: Secondary | ICD-10-CM

## 2016-12-05 DIAGNOSIS — M25571 Pain in right ankle and joints of right foot: Secondary | ICD-10-CM | POA: Insufficient documentation

## 2016-12-05 DIAGNOSIS — F1510 Other stimulant abuse, uncomplicated: Secondary | ICD-10-CM | POA: Diagnosis present

## 2016-12-05 DIAGNOSIS — M659 Synovitis and tenosynovitis, unspecified: Secondary | ICD-10-CM

## 2016-12-05 DIAGNOSIS — B1910 Unspecified viral hepatitis B without hepatic coma: Secondary | ICD-10-CM | POA: Diagnosis present

## 2016-12-05 DIAGNOSIS — F1123 Opioid dependence with withdrawal: Secondary | ICD-10-CM | POA: Diagnosis not present

## 2016-12-05 DIAGNOSIS — M009 Pyogenic arthritis, unspecified: Secondary | ICD-10-CM | POA: Diagnosis present

## 2016-12-05 DIAGNOSIS — Z720 Tobacco use: Secondary | ICD-10-CM

## 2016-12-05 DIAGNOSIS — B1920 Unspecified viral hepatitis C without hepatic coma: Secondary | ICD-10-CM | POA: Diagnosis present

## 2016-12-05 DIAGNOSIS — L03115 Cellulitis of right lower limb: Secondary | ICD-10-CM

## 2016-12-05 HISTORY — DX: Other psychoactive substance abuse, uncomplicated: F19.10

## 2016-12-05 HISTORY — DX: Opioid abuse, uncomplicated: F11.10

## 2016-12-05 HISTORY — DX: Other specified abnormal immunological findings in serum: R76.8

## 2016-12-05 LAB — CBC WITH DIFFERENTIAL/PLATELET
Basophils Absolute: 0 10*3/uL (ref 0.0–0.1)
Basophils Relative: 0 %
Eosinophils Absolute: 0.4 10*3/uL (ref 0.0–0.7)
Eosinophils Relative: 5 %
HCT: 33.6 % — ABNORMAL LOW (ref 39.0–52.0)
Hemoglobin: 11.1 g/dL — ABNORMAL LOW (ref 13.0–17.0)
LYMPHS ABS: 1.9 10*3/uL (ref 0.7–4.0)
LYMPHS PCT: 28 %
MCH: 30.5 pg (ref 26.0–34.0)
MCHC: 33 g/dL (ref 30.0–36.0)
MCV: 92.3 fL (ref 78.0–100.0)
MONOS PCT: 17 %
Monocytes Absolute: 1.2 10*3/uL — ABNORMAL HIGH (ref 0.1–1.0)
NEUTROS PCT: 50 %
Neutro Abs: 3.4 10*3/uL (ref 1.7–7.7)
Platelets: 252 10*3/uL (ref 150–400)
RBC: 3.64 MIL/uL — ABNORMAL LOW (ref 4.22–5.81)
RDW: 12.5 % (ref 11.5–15.5)
WBC: 6.8 10*3/uL (ref 4.0–10.5)

## 2016-12-05 LAB — C-REACTIVE PROTEIN: CRP: 5.1 mg/dL — ABNORMAL HIGH (ref <1.0)

## 2016-12-05 LAB — SEDIMENTATION RATE: Sed Rate: 54 mm/hr — ABNORMAL HIGH (ref 0–16)

## 2016-12-05 MED ORDER — MORPHINE SULFATE (PF) 4 MG/ML IV SOLN
4.0000 mg | Freq: Once | INTRAVENOUS | Status: AC
  Administered 2016-12-05: 4 mg via INTRAVENOUS
  Filled 2016-12-05: qty 1

## 2016-12-05 NOTE — ED Provider Notes (Signed)
[facility]   CSN: [id number] Arrival date & time: 12/05/16  2047  By signing my name below, I, Nelwyn Salisbury, attest that this documentation has been prepared under the direction and in the presence of Dione Booze, MD . Electronically Signed: Nelwyn Salisbury, Scribe. 12/05/2016. 11:34 PM.  History   Chief Complaint Chief Complaint  Patient presents with  . Joint Swelling   The history is provided by the patient. No language interpreter was used.    HPI Comments:  David [name] is a 36 y.o. male with pmhx of Hepatitis C and Heroin abuse who presents to the Emergency Department complaining of constant, worsening right ankle swelling onset 2 weeks. He reports associated pain to the area described as an 8/10. No mechanism of injury indicated. Pt was seen at [facility] for similar symptoms and told to come to [facility] hospital for an MRI. He states that first went to bed and did not come to the hospital straight away. Denies any fevers, chills or diaphoresis. Pt is an active drug abuser and confirms frequent use of heroine and "ice" with his last user yesterday.   Past Medical History:  Diagnosis Date  . Dialysis care    no longer in dialysis  . Hepatitis C antibody positive in blood   . Heroin abuse   . Kidney stones   . Polysubstance abuse     Patient Active Problem List   Diagnosis Date Noted  . Heroin abuse 09/07/2013    Past Surgical History:  Procedure Laterality Date  . I&D EXTREMITY Left 09/07/2013   Procedure: IRRIGATION AND DEBRIDEMENT EXTREMITY;  Surgeon: Dominica Severin, MD;  Location: [facility] OR;  Service: Orthopedics;  Laterality: Left;       Home Medications    Prior to Admission medications   Not on File    Family History No family history on file.  Social History Social History  Substance Use Topics  . Smoking status: Current Every Day Smoker    Packs/day: 1.00    Types: Cigarettes  . Smokeless tobacco: Never Used  . Alcohol use Yes   Comment: denies 07/07/16     Allergies   Patient has no known allergies.   Review of Systems Review of Systems  Constitutional: Negative for chills, diaphoresis and fever.  Musculoskeletal: Positive for arthralgias and joint swelling.  All other systems reviewed and are negative.    Physical Exam Updated Vital Signs BP 113/79   Pulse 94   Temp 98.2 F (36.8 C) (Oral)   Resp 18   SpO2 100%   Physical Exam  Constitutional: He is oriented to person, place, and time. He appears well-developed and well-nourished.  HENT:  Head: Normocephalic and atraumatic.  Eyes: EOM are normal. Pupils are equal, round, and reactive to light.  Neck: Normal range of motion. Neck supple. No JVD present.  Cardiovascular: Normal rate, regular rhythm and normal heart sounds.   No murmur heard. Pulmonary/Chest: Effort normal and breath sounds normal. He has no wheezes. He has no rales. He exhibits no tenderness.  Abdominal: Soft. Bowel sounds are normal. He exhibits no distension and no mass. There is no tenderness.  Musculoskeletal: Normal range of motion. He exhibits edema.  Right ankle has generalized soft tissue swelling with mild erythema and warmth. Pain on passive ROM.   Lymphadenopathy:    He has no cervical adenopathy.  Neurological: He is alert and oriented to person, place, and time. No cranial nerve deficit. He exhibits normal muscle tone. Coordination normal.  Skin: Skin is warm and dry. No rash noted.  Psychiatric: He has a normal mood and affect. His behavior is normal. Judgment and thought content normal.  Nursing note and vitals reviewed.    ED Treatments / Results  DIAGNOSTIC STUDIES:  Oxygen Saturation is 100% on RA, normal by my interpretation.    COORDINATION OF CARE:  11:37 PM Discussed treatment plan with pt at bedside which includes MRI imaging and pt agreed to plan.  Labs (all labs ordered are listed, but only abnormal results are displayed) Labs Reviewed  CBC  WITH DIFFERENTIAL/PLATELET - Abnormal; Notable for the following:       Result Value   RBC 3.95 (*)    Hemoglobin 11.8 (*)    HCT 35.3 (*)    All other components within normal limits  BASIC METABOLIC PANEL - Abnormal; Notable for the following:    BUN <5 (*)    Creatinine, Ser 0.59 (*)    All other components within normal limits  I-STAT CG4 LACTIC ACID, ED    Radiology Dg Ankle Complete Right  Result Date: 12/05/2016 CLINICAL DATA:  Right ankle pain for 2 weeks. Swelling. No known injury. EXAM: RIGHT ANKLE - COMPLETE 3+ VIEW COMPARISON:  None. FINDINGS: There is no evidence of fracture or dislocation. There is no evidence of arthropathy or other focal bone abnormality. There is a small tibiotalar joint effusion. Mild lateral soft tissue edema. IMPRESSION: Soft tissue edema and small joint effusion.  No osseous abnormality. Electronically Signed   By: Rubye OaksMelanie  Ehinger M.D.   On: 12/05/2016 03:16   Mr Ankle Right W Wo Contrast  Result Date: 12/06/2016 CLINICAL DATA:  Hepatitis-C and heroin abuser complaining of worsening right ankle swelling and pain onset 2 weeks ago. EXAM: MRI OF THE RIGHT ANKLE WITHOUT AND WITH CONTRAST TECHNIQUE: Multiplanar, multisequence MR imaging of the ankle was performed before and after the administration of intravenous contrast. CONTRAST:  15 cc MultiHance IV COMPARISON:  Radiographs from the same day FINDINGS: TENDONS Peroneal: Intact peroneus longus and peroneus brevis tendons. Posteromedial: Intact tibialis posterior, flexor hallucis longus and flexor digitorum longus tendons. Anterior: Intact tibialis anterior, extensor hallucis longus and extensor digitorum longus tendons. Achilles: Intact. Plantar Fascia: Intact. LIGAMENTS Lateral: Intact. Medial: Intact. CARTILAGE Ankle Joint: Small to moderate enhancing tibiotalar and small posterior subtalar joint effusion may be secondary to a synovitis. A septic arthritis is not entirely excluded though preservation of the  cartilage within the ankle and subtalar joints would make this less likely. Subtalar Joints/Sinus Tarsi: Intact with small posterior subtalar joint effusion. Bones: Minimal reactive edema about the tibiotalar and subtalar joints. Other: Soft tissue edema about the malleoli and hindfoot. IMPRESSION: IMPRESSION Soft tissue edema consistent with cellulitis. Enhancing ankle and subtalar joint effusions are likely related to a synovitis and less likely septic arthritis given the preservation of the cartilage within the joint spaces. No drainable fluid collections to suggest abscess. No evidence of a pyomyositis. Electronically Signed   By: Tollie Ethavid  Kwon M.D.   On: 12/06/2016 03:30    Procedures Procedures (including critical care time)  Medications Ordered in ED Medications  gadobenate dimeglumine (MULTIHANCE) injection 15 mL (not administered)  vancomycin (VANCOCIN) IVPB 1000 mg/200 mL premix (1,000 mg Intravenous New Bag/Given 12/06/16 0354)  HYDROcodone-acetaminophen (NORCO/VICODIN) 5-325 MG per tablet 1-2 tablet (2 tablets Oral Given 12/06/16 0356)  vancomycin (VANCOCIN) IVPB 1000 mg/200 mL premix (not administered)  morphine 4 MG/ML injection 4 mg (4 mg Intravenous Given 12/05/16 2357)  Initial Impression / Assessment and Plan / ED Course  I have reviewed the triage vital signs and the nursing notes.  Pertinent labs & imaging results that were available during my care of the patient were reviewed by me and considered in my medical decision making (see chart for details).  Pain and swelling in the right ankle in patient with history of IV drug abuse for a worrisome for septic joint. Old records are reviewed, and he was in the ED and [facility] earlier today and was referred here for MRI of the ankle to rule out septic joint. Patient apparently was not aware that he should, here immediately, so there is delay of approximately 16 hours. MRI of the ankle is ordered to evaluate for  possible septic joint.  MRI is consistent with cellulitis but no clear evidence of septic joint. He is started on vancomycin for infection. With [facility] consultation will been needed. Case is discussed with Dr. Julian Reil of [facility] agrees to admit the patient.  Final Clinical Impressions(s) / ED Diagnoses   Final diagnoses:  Pain and swelling of right ankle  Cellulitis of right ankle  Synovitis of right ankle  Effusion, right ankle  Normochromic normocytic anemia    New Prescriptions New Prescriptions   No medications on file   I personally performed the services described in this documentation, which was scribed in my presence. The recorded information has been reviewed and is accurate.       Dione Booze, MD 12/06/16 [phone]

## 2016-12-05 NOTE — ED Notes (Signed)
ED Provider at bedside. 

## 2016-12-05 NOTE — Discharge Instructions (Signed)
[facility]. Tell them you were transferred from [facility] to have an MRI of your right ankle. If the MRI shows evidence of infection in the joint you need to have an orthopedist evaluate you.

## 2016-12-05 NOTE — ED Triage Notes (Signed)
Swelling and pain in right ankle x2 weeks.  Progressively getting worse. No known injury.

## 2016-12-05 NOTE — ED Triage Notes (Signed)
Pt states he was seen at [facility] and told he may have broken ankle; Pt denies injury; pt states he start having swelling of right ankle 2 weeks ago; Pt states Md told him that he had fluid on the ankle; pt states pain at 7/10;pt able to ambulated to triage room; Pt a&ox  4 on arrival. Pt has significant swelling to right ankle; pt had xrays done last night

## 2016-12-05 NOTE — ED Provider Notes (Signed)
[facility] Provider Note: David [name] [name], MD, FACEP  CSN: [id number] MRN: [id number] ARRIVAL: 12/05/16 at 0043 ROOM: [facility]03/[facility]03   CHIEF COMPLAINT  Ankle Pain   HISTORY OF PRESENT ILLNESS  David SchlatterRichard [name] is a 36 y.o. male with a history of polysubstance abuse including heroin. He is here with pain and swelling in his right ankle extending to the lateral right foot. The current episode has been occurring for about a week and a half. The pain and swelling have waxed and waned throughout that time. He has had this happen several times in the past. Sometimes he is able to "pop" his ankle back into place and eased the symptoms but he has not been able to on this occasion. He denies injuring the ankle. He denies fever or chills. There is some erythema and warmth associated with the ankle swelling. He states the pain currently is an 8 out of 10 and he is unable to bear weight on it. The pain is located primarily in the anterior aspect of the ankle. He denies calf pain or swelling.   Past Medical History:  Diagnosis Date  . Dialysis care    no longer in dialysis  . Hepatitis C antibody positive in blood   . Heroin abuse   . Kidney stones   . Polysubstance abuse     Past Surgical History:  Procedure Laterality Date  . I&D EXTREMITY Left 09/07/2013   Procedure: IRRIGATION AND DEBRIDEMENT EXTREMITY;  Surgeon: Dominica SeverinWilliam Gramig, MD;  Location: [facility] OR;  Service: Orthopedics;  Laterality: Left;    No family history on file.  Social History  Substance Use Topics  . Smoking status: Current Every Day Smoker    Packs/day: 1.00    Types: Cigarettes  . Smokeless tobacco: Never Used  . Alcohol use Yes     Comment: denies 07/07/16    Prior to Admission medications   Medication Sig Start Date End Date Taking? Authorizing Provider  amoxicillin (AMOXIL) 500 MG capsule Take 1 capsule (500 mg total) by mouth 3 (three) times daily. 09/10/13   Dominica SeverinGramig, William, MD  cephALEXin (KEFLEX) 500 MG  capsule Take 1 capsule (500 mg total) by mouth 4 (four) times daily. 04/16/16   Hedges, Tinnie GensJeffrey, PA-C  HYDROcodone-acetaminophen (NORCO/VICODIN) 5-325 MG tablet Take 1 tablet by mouth every 4 (four) hours as needed. 07/07/16   Jacalyn LefevreHaviland, Julie, MD  oxyCODONE (OXY IR/ROXICODONE) 5 MG immediate release tablet Take 1-2 tablets (5-10 mg total) by mouth every 4 (four) hours as needed for moderate pain. 09/10/13   Dominica SeverinGramig, William, MD  vitamin C (VITAMIN C) 1000 MG tablet Take 1 tablet (1,000 mg total) by mouth daily. 09/10/13   Dominica SeverinGramig, William, MD    Allergies Patient has no known allergies.   REVIEW OF SYSTEMS  Negative except as noted here or in the History of Present Illness.   PHYSICAL EXAMINATION  Initial Vital Signs Blood pressure 126/78, pulse (!) 108, temperature 98.2 F (36.8 C), temperature source Oral, resp. rate 19, height 5\' 10"  (1.778 m), weight 150 lb (68 kg), SpO2 100 %.  Examination General: Well-developed, well-nourished male in no acute distress; appearance consistent with age of record HENT: normocephalic; atraumatic Eyes: pupils equal, round and reactive to light; extraocular muscles intact Neck: supple Heart: regular rate and rhythm Lungs: clear to auscultation bilaterally Abdomen: soft; nondistended; nontender; bowel sounds present Extremities: No deformity; full range of motion except right ankle; pulses normal; swelling and tenderness of right ankle with mild erythema and warmth  of the right ankle and lateral right foot:    Neurologic: Awake but groggy; motor function intact in all extremities and symmetric; no facial droop Skin: Warm and dry Psychiatric: Flat affect   RESULTS  Summary of this visit's results, reviewed by myself:   EKG Interpretation  Date/Time:    Ventricular Rate:    PR Interval:    QRS Duration:   QT Interval:    QTC Calculation:   R Axis:     Text Interpretation:        Laboratory Studies: Results for orders placed or performed  during the hospital encounter of 12/05/16 (from the past 24 hour(s))  CBC with Differential/Platelet     Status: Abnormal   Collection Time: 12/05/16  3:53 AM  Result Value Ref Range   WBC 6.8 4.0 - 10.5 K/uL   RBC 3.64 (L) 4.22 - 5.81 MIL/uL   Hemoglobin 11.1 (L) 13.0 - 17.0 g/dL   HCT 16.1 (L) 09.6 - 04.5 %   MCV 92.3 78.0 - 100.0 fL   MCH 30.5 26.0 - 34.0 pg   MCHC 33.0 30.0 - 36.0 g/dL   RDW 40.9 81.1 - 91.4 %   Platelets 252 150 - 400 K/uL   Neutrophils Relative % 50 %   Neutro Abs 3.4 1.7 - 7.7 K/uL   Lymphocytes Relative 28 %   Lymphs Abs 1.9 0.7 - 4.0 K/uL   Monocytes Relative 17 %   Monocytes Absolute 1.2 (H) 0.1 - 1.0 K/uL   Eosinophils Relative 5 %   Eosinophils Absolute 0.4 0.0 - 0.7 K/uL   Basophils Relative 0 %   Basophils Absolute 0.0 0.0 - 0.1 K/uL  Sedimentation rate     Status: Abnormal   Collection Time: 12/05/16  3:53 AM  Result Value Ref Range   Sed Rate 54 (H) 0 - 16 mm/hr   Imaging Studies: Dg Ankle Complete Right  Result Date: 12/05/2016 CLINICAL DATA:  Right ankle pain for 2 weeks. Swelling. No known injury. EXAM: RIGHT ANKLE - COMPLETE 3+ VIEW COMPARISON:  None. FINDINGS: There is no evidence of fracture or dislocation. There is no evidence of arthropathy or other focal bone abnormality. There is a small tibiotalar joint effusion. Mild lateral soft tissue edema. IMPRESSION: Soft tissue edema and small joint effusion.  No osseous abnormality. Electronically Signed   By: Rubye Oaks M.D.   On: 12/05/2016 03:16    ED COURSE  Nursing notes and initial vitals signs, including pulse oximetry, reviewed.  Vitals:   12/05/16 0048 12/05/16 0051  BP: 126/78   Pulse: (!) 108   Resp: 19   Temp: 98.2 F (36.8 C)   TempSrc: Oral   SpO2: 100%   Weight:  150 lb (68 kg)  Height:  5\' 10"  (1.778 m)   5:44 AM Discussed with Dr. Linna Caprice of [facility]. He recommends that if inflammatory markers are elevated patient go to [facility] for an MRI  and possible orthopedic consult if MRI shows evidence of a septic joint. Discussed with Dr. Devoria Albe who accepts the patient in transfer.  PROCEDURES    ED DIAGNOSES     ICD-9-CM ICD-10-CM   1. Pain and swelling of right ankle 719.47 M25.571    719.07 M25.471        Paula Libra, MD 12/05/16 [phone]

## 2016-12-05 NOTE — ED Notes (Signed)
Glick MD at bedside  

## 2016-12-06 ENCOUNTER — Encounter (HOSPITAL_COMMUNITY): Payer: Self-pay | Admitting: Internal Medicine

## 2016-12-06 ENCOUNTER — Emergency Department (HOSPITAL_COMMUNITY): Payer: Self-pay

## 2016-12-06 DIAGNOSIS — F1110 Opioid abuse, uncomplicated: Secondary | ICD-10-CM

## 2016-12-06 DIAGNOSIS — M659 Synovitis and tenosynovitis, unspecified: Secondary | ICD-10-CM | POA: Diagnosis present

## 2016-12-06 DIAGNOSIS — M25471 Effusion, right ankle: Secondary | ICD-10-CM

## 2016-12-06 DIAGNOSIS — F1910 Other psychoactive substance abuse, uncomplicated: Secondary | ICD-10-CM

## 2016-12-06 LAB — CBC WITH DIFFERENTIAL/PLATELET
BASOS PCT: 0 %
Basophils Absolute: 0 10*3/uL (ref 0.0–0.1)
Eosinophils Absolute: 0.2 10*3/uL (ref 0.0–0.7)
Eosinophils Relative: 3 %
HEMATOCRIT: 35.3 % — AB (ref 39.0–52.0)
Hemoglobin: 11.8 g/dL — ABNORMAL LOW (ref 13.0–17.0)
LYMPHS PCT: 25 %
Lymphs Abs: 1.9 10*3/uL (ref 0.7–4.0)
MCH: 29.9 pg (ref 26.0–34.0)
MCHC: 33.4 g/dL (ref 30.0–36.0)
MCV: 89.4 fL (ref 78.0–100.0)
MONOS PCT: 11 %
Monocytes Absolute: 0.8 10*3/uL (ref 0.1–1.0)
NEUTROS ABS: 4.8 10*3/uL (ref 1.7–7.7)
NEUTROS PCT: 61 %
PLATELETS: 248 10*3/uL (ref 150–400)
RBC: 3.95 MIL/uL — ABNORMAL LOW (ref 4.22–5.81)
RDW: 12.7 % (ref 11.5–15.5)
WBC: 7.7 10*3/uL (ref 4.0–10.5)

## 2016-12-06 LAB — BASIC METABOLIC PANEL
Anion gap: 7 (ref 5–15)
BUN: <5 mg/dL — ABNORMAL LOW (ref 6–20)
CALCIUM: 9.1 mg/dL (ref 8.9–10.3)
CO2: 26 mmol/L (ref 22–32)
CREATININE: 0.59 mg/dL — AB (ref 0.61–1.24)
Chloride: 104 mmol/L (ref 101–111)
GFR calc non Af Amer: >60 mL/min (ref >60)
Glucose, Bld: 94 mg/dL (ref 65–99)
Potassium: 4.3 mmol/L (ref 3.5–5.1)
Sodium: 137 mmol/L (ref 135–145)

## 2016-12-06 LAB — SYNOVIAL CELL COUNT + DIFF, W/ CRYSTALS
Crystals, Fluid: NONE SEEN
EOSINOPHILS-SYNOVIAL: 0 % (ref 0–1)
Lymphocytes-Synovial Fld: 5 % (ref 0–20)
Monocyte-Macrophage-Synovial Fluid: 15 % — ABNORMAL LOW (ref 50–90)
NEUTROPHIL, SYNOVIAL: 80 % — AB (ref 0–25)
WBC, SYNOVIAL: 52400 /mm3 — AB (ref 0–200)

## 2016-12-06 LAB — HIV ANTIBODY (ROUTINE TESTING W REFLEX): HIV Screen 4th Generation wRfx: NONREACTIVE

## 2016-12-06 LAB — GRAM STAIN

## 2016-12-06 LAB — I-STAT CG4 LACTIC ACID, ED: Lactic Acid, Venous: 1.41 mmol/L (ref 0.5–1.9)

## 2016-12-06 MED ORDER — HYDROCODONE-ACETAMINOPHEN 5-325 MG PO TABS
1.0000 | ORAL_TABLET | Freq: Four times a day (QID) | ORAL | Status: DC | PRN
  Administered 2016-12-06 – 2016-12-09 (×8): 2 via ORAL
  Filled 2016-12-06 (×8): qty 2

## 2016-12-06 MED ORDER — LIDOCAINE HCL 1 % IJ SOLN
5.0000 mL | Freq: Once | INTRAMUSCULAR | Status: DC
Start: 2016-12-06 — End: 2016-12-09
  Filled 2016-12-06: qty 5

## 2016-12-06 MED ORDER — VANCOMYCIN HCL IN DEXTROSE 1-5 GM/200ML-% IV SOLN
1000.0000 mg | Freq: Three times a day (TID) | INTRAVENOUS | Status: DC
  Administered 2016-12-06 – 2016-12-07 (×4): 1000 mg via INTRAVENOUS
  Filled 2016-12-06 (×5): qty 200

## 2016-12-06 MED ORDER — HEPARIN SODIUM (PORCINE) 5000 UNIT/ML IJ SOLN
5000.0000 [IU] | Freq: Three times a day (TID) | INTRAMUSCULAR | Status: DC
  Administered 2016-12-06 – 2016-12-09 (×5): 5000 [IU] via SUBCUTANEOUS
  Filled 2016-12-06 (×6): qty 1

## 2016-12-06 MED ORDER — IBUPROFEN 400 MG PO TABS
400.0000 mg | ORAL_TABLET | Freq: Three times a day (TID) | ORAL | Status: DC
  Administered 2016-12-06 (×2): 400 mg via ORAL
  Filled 2016-12-06 (×2): qty 1

## 2016-12-06 MED ORDER — GADOBENATE DIMEGLUMINE 529 MG/ML IV SOLN: 15.0000 mL | Freq: Once | INTRAVENOUS | Status: DC | PRN

## 2016-12-06 MED ORDER — PROMETHAZINE HCL 25 MG/ML IJ SOLN
25.0000 mg | Freq: Four times a day (QID) | INTRAMUSCULAR | Status: DC | PRN
  Administered 2016-12-09 (×2): 25 mg via INTRAVENOUS
  Filled 2016-12-06 (×2): qty 1

## 2016-12-06 MED ORDER — MORPHINE SULFATE (PF) 4 MG/ML IV SOLN
4.0000 mg | Freq: Four times a day (QID) | INTRAVENOUS | Status: DC | PRN
  Administered 2016-12-07 – 2016-12-09 (×8): 4 mg via INTRAVENOUS
  Filled 2016-12-06 (×8): qty 1

## 2016-12-06 MED ORDER — VANCOMYCIN HCL IN DEXTROSE 1-5 GM/200ML-% IV SOLN
1000.0000 mg | Freq: Once | INTRAVENOUS | Status: AC
  Administered 2016-12-06: 1000 mg via INTRAVENOUS
  Filled 2016-12-06: qty 200

## 2016-12-06 MED ORDER — KETOROLAC TROMETHAMINE 10 MG PO TABS
10.0000 mg | ORAL_TABLET | Freq: Four times a day (QID) | ORAL | Status: DC
  Administered 2016-12-06 – 2016-12-09 (×11): 10 mg via ORAL
  Filled 2016-12-06 (×13): qty 1

## 2016-12-06 MED ORDER — CLONIDINE HCL 0.1 MG PO TABS: 0.1000 mg | ORAL_TABLET | Freq: Three times a day (TID) | ORAL | Status: DC | PRN

## 2016-12-06 NOTE — Consult Note (Signed)
Reason for Consult:Right ankle infection Referring Physician: C Kobie [name] is an 36 y.o. male.  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 Orthopedics was contacted and recommended transfer to [facility] and MRI. The pt refused transport and agreed to come via POV. He ended going home and going to bed then showed back up to ED at [facility] about 18h later. He states the pain is a bit better since he was in ED yesterday morning and he attributes that to being off of it for most of the time. He denies previous episodes. He denies injecting in his feet. He denies any fevers, chills, sweats, N/V.  Past Medical History:  Diagnosis Date  . Dialysis care    no longer in dialysis  . 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 Heroin abuse   . Kidney stones   . Polysubstance abuse     Past Surgical History:  Procedure Laterality Date  . I&D EXTREMITY Left 09/07/2013   Procedure: IRRIGATION AND DEBRIDEMENT EXTREMITY;  Surgeon: Roseanne Kaufman, MD;  Location: [facility];  Service: Orthopedics;  Laterality: Left;    Family History  Problem Relation Age of Onset  . Diabetes type II Mother   . Drug abuse Father     Social History:  reports that he has been smoking Cigarettes.  He has been smoking about 1.00 pack per day. He has never used smokeless tobacco. He reports that he drinks alcohol. He reports that he uses drugs, including Marijuana.  Allergies: No Known Allergies  Medications: I have reviewed the patient's current medications.  Results for orders placed or performed during the hospital encounter of 12/05/16 (from the past 48 hour(s))  CBC with Differential     Status: Abnormal   Collection Time: 12/05/16 11:53 PM  Result Value Ref Range   WBC 7.7 4.0 - 10.5 K/uL   RBC 3.95 (L) 4.22 - 5.81 MIL/uL   Hemoglobin 11.8 (L) 13.0 - 17.0 g/dL   HCT 35.3 (L) 39.0 - 52.0 %   MCV 89.4 78.0 - 100.0 fL   MCH 29.9 26.0 - 34.0 pg    MCHC 33.4 30.0 - 36.0 g/dL   RDW 12.7 11.5 - 15.5 %   Platelets 248 150 - 400 K/uL   Neutrophils Relative % 61 %   Neutro Abs 4.8 1.7 - 7.7 K/uL   Lymphocytes Relative 25 %   Lymphs Abs 1.9 0.7 - 4.0 K/uL   Monocytes Relative 11 %   Monocytes Absolute 0.8 0.1 - 1.0 K/uL   Eosinophils Relative 3 %   Eosinophils Absolute 0.2 0.0 - 0.7 K/uL   Basophils Relative 0 %   Basophils Absolute 0.0 0.0 - 0.1 K/uL  Basic metabolic panel     Status: Abnormal   Collection Time: 12/05/16 11:53 PM  Result Value Ref Range   Sodium 137 135 - 145 mmol/L   Potassium 4.3 3.5 - 5.1 mmol/L   Chloride 104 101 - 111 mmol/L   CO2 26 22 - 32 mmol/L   Glucose, Bld 94 65 - 99 mg/dL   BUN <5 (L) 6 - 20 mg/dL   Creatinine, Ser 0.59 (L) 0.61 - 1.24 mg/dL   Calcium 9.1 8.9 - 10.3 mg/dL   GFR calc non Af Amer >60 >60 mL/min   GFR calc Af Amer >60 >60 mL/min    Comment: (NOTE) The eGFR has been calculated using the CKD  EPI equation. This calculation has not been validated in all clinical situations. eGFR's persistently <60 mL/min signify possible Chronic Kidney Disease.    Anion gap 7 5 - 15  I-Stat CG4 Lactic Acid, ED     Status: None   Collection Time: 12/06/16 12:33 AM  Result Value Ref Range   Lactic Acid, Venous 1.41 0.5 - 1.9 mmol/L    Dg Ankle Complete Right  Result Date: 12/05/2016 CLINICAL DATA:  Right ankle pain for 2 weeks. Swelling. No known injury. EXAM: RIGHT ANKLE - COMPLETE 3+ VIEW COMPARISON:  None. FINDINGS: There is no evidence of fracture or dislocation. There is no evidence of arthropathy or other focal bone abnormality. There is a small tibiotalar joint effusion. Mild lateral soft tissue edema. IMPRESSION: Soft tissue edema and small joint effusion.  No osseous abnormality. Electronically Signed   By: Jeb Levering M.D.   On: 12/05/2016 03:16   Mr Ankle Right W Wo Contrast  Result Date: 12/06/2016 CLINICAL DATA:  Hepatitis-David and heroin abuser complaining of worsening right ankle  swelling and pain onset 2 weeks ago. EXAM: MRI OF THE RIGHT ANKLE WITHOUT AND WITH CONTRAST TECHNIQUE: Multiplanar, multisequence MR imaging of the ankle was performed before and after the administration of intravenous contrast. CONTRAST:  15 cc MultiHance IV COMPARISON:  Radiographs from the same day FINDINGS: TENDONS Peroneal: Intact peroneus longus and peroneus brevis tendons. Posteromedial: Intact tibialis posterior, flexor hallucis longus and flexor digitorum longus tendons. Anterior: Intact tibialis anterior, extensor hallucis longus and extensor digitorum longus tendons. Achilles: Intact. Plantar Fascia: Intact. LIGAMENTS Lateral: Intact. Medial: Intact. CARTILAGE Ankle Joint: Small to moderate enhancing tibiotalar and small posterior subtalar joint effusion may be secondary to a synovitis. A septic arthritis is not entirely excluded though preservation of the cartilage within the ankle and subtalar joints would make this less likely. Subtalar Joints/Sinus Tarsi: Intact with small posterior subtalar joint effusion. Bones: Minimal reactive edema about the tibiotalar and subtalar joints. Other: Soft tissue edema about the malleoli and hindfoot. IMPRESSION: IMPRESSION Soft tissue edema consistent with cellulitis. Enhancing ankle and subtalar joint effusions are likely related to a synovitis and less likely septic arthritis given the preservation of the cartilage within the joint spaces. No drainable fluid collections to suggest abscess. No evidence of a pyomyositis. Electronically Signed   By: Ashley Royalty M.D.   On: 12/06/2016 03:30    Review of Systems  Constitutional: Negative for weight loss.  HENT: Negative for ear discharge, ear pain, hearing loss and tinnitus.   Eyes: Negative for blurred vision, double vision, photophobia and pain.  Respiratory: Negative for cough, sputum production and shortness of breath.   Cardiovascular: Negative for chest pain.  Gastrointestinal: Negative for abdominal pain,  nausea and vomiting.  Genitourinary: Negative for dysuria, flank pain, frequency and urgency.  Musculoskeletal: Positive for joint pain (Right ankle). Negative for back pain, falls, myalgias and neck pain.  Neurological: Negative for dizziness, tingling, sensory change, focal weakness, loss of consciousness and headaches.  Endo/Heme/Allergies: Does not bruise/bleed easily.  Psychiatric/Behavioral: Negative for depression, memory loss and substance abuse. The patient is not nervous/anxious.    Blood pressure 113/60, pulse 93, temperature 99.3 F (37.4 David), temperature source Oral, resp. rate 16, height 5' 10"  (1.778 m), weight 64.4 kg (142 lb), SpO2 98 %. Physical Exam  Constitutional: He appears well-developed and well-nourished. No distress.  HENT:  Head: Normocephalic.  Eyes: Conjunctivae are normal. Right eye exhibits no discharge. Left eye exhibits no discharge. No scleral icterus.  Cardiovascular: Normal rate and regular rhythm.   Respiratory: Effort normal. No respiratory distress.  Musculoskeletal:  RLE No traumatic wounds, ecchymosis, or rash  Ankle swollen, TTP laterally>medially, warm but little to no erythema  No effusions  Knee stable to varus/ valgus and anterior/posterior stress  Sens DPN, SPN, TN intact  Motor EHL, ext, flex, evers 5/5  DP 2+, PT 1+, No significant edema   LLE No traumatic wounds, ecchymosis, or rash  Nontender  No effusions  Knee stable to varus/ valgus and anterior/posterior stress  Sens DPN, SPN, TN intact  Motor EHL, ext, flex, evers 5/5  DP 2+, PT 2+, No significant edema  Neurological: He is alert.  Skin: Skin is warm and dry. He is not diaphoretic.  Psychiatric: He has a normal mood and affect. His behavior is normal.    Assessment/Plan: Right ankle infection -- Agree with abx. Will aspirate joint to make sure not septic. Ok to [place], likely benefit from crutches to aid ambulation. [place] for diet if aspiration negative for purulence. IVDA      Lisette Abu, PA-[facility] [phone] 12/06/2016, 9:46 AM

## 2016-12-06 NOTE — Procedures (Signed)
Clinician: Mearl LatinKeith W. Amiee Wiley, PA-C  Specimen: 18 cc inflammatory appearing synovial fluid   Complications: none  Intra-articular medications: none, infiltrated skin with 3 cc 1% lidocaine  Description  Utilizing sterile technique and ankle joint was identified medially utilizing the lateral border of the medial malleolus and the medial border of the tibialis anterior tendon. Joint line was identified. This area was prepped with Betadine 3. Areas infiltrated with approximately 3 mL of lidocaine. After adequate skin anesthesia was achieved a 22-gauge needle was inserted into the ankle joint. Withdrawing a plunger on the way until the joint was reached. Inflammatory fluid was encountered and a total of 18 mL of inflammatory appearing fluid was obtained from the ankle joint. No point in time did I appreciate any gross purulence. This fluid was sent down for stat Gram stain along with synovial fluid cell count and crystals. Cultures were also obtained off of the sample. Patient tolerated the procedure well. Pt did not have any unexpected discomfort with the procedure.   Again the patient is a 36 year old white male history of daily IV drug use was seen at [facility] yesterday and was [facility] including MRI of his right ankle. Patient has been having symptoms of right ankle pain and swelling for approximately 2 weeks. Patient showed up to the hospital this morning. MRI was obtained. Notable for ankle fusion along with cellulitis. No obvious evidence of septic arthritis on MRI.   Based on clinical exam there is no hard findings of septic arthritis. Patient was tolerating both active and passive motion of his ankle without significant pain. There is no appreciable erythema around his ankle joint. He is more swollen compared to the contralateral side but again does not appear to be septic arthritis. Fluid samples will be sent down to the lab for  evaluation. We will keep him npo in the event his Gram stain is positive or his cell count is suggestive of active infection.  Mearl LatinKeith W. Tymeshia Awan, PA-C [facility] [phone]) 12/06/2016 3:12 PM

## 2016-12-06 NOTE — Progress Notes (Signed)
Pt sweating and shaking.  Pt says that he feels like he is going to vomit.  MD paged.

## 2016-12-06 NOTE — Progress Notes (Signed)
Patient seen and examined. Admitted after midnight secondary to right ankle swelling, pain and warm sensation. MRI demonstrating synovitis. Patient is an active IV heroin drug user; high risk for septic joint. Currently afebrile, WBC's WNL and even with decrease range of motion and pain, endorses mild improvement in swelling after no weight bearing for hours now. No CP, no nausea, no SOB. Please refer to H&P written by Dr. Julian ReilGardner for further info/details on admission.  Plan: -will continue vancomycin for now -orthopedic service has been consulted; will follow recommendations. -continue supportive care -PRN analgesics and heating pads.  Vassie LollMadera, [place]h [phone]

## 2016-12-06 NOTE — ED Notes (Signed)
Nurse collected labs. 

## 2016-12-06 NOTE — Progress Notes (Signed)
Pharmacy Antibiotic Note  David SchlatterRichard [name] is a 36 y.o. male admitted on 12/05/2016 with septic arthritis.  Pharmacy has been consulted for Vancomycin dosing.  Plan: Vancomycin 1gm IV q8h Will f/u micro data, renal function, and pt's clinical condition Vanc trough prn      Temp (24hrs), Avg:98.7 F (37.1 C), Min:98.2 F (36.8 C), Max:99.2 F (37.3 C)   Recent Labs Lab 12/05/16 0353 12/05/16 2353 12/06/16 0033  WBC 6.8 7.7  --   CREATININE  --  0.59*  --   LATICACIDVEN  --   --  1.41    Estimated Creatinine Clearance: 122.8 mL/min (A) (by C-G formula based on SCr of 0.59 mg/dL (L)).    No Known Allergies  Antimicrobials this admission: 5/18 Vanc >>   Dose adjustments this admission: n/a   Thank you for allowing pharmacy to be a part of this patient's care.  Christoper Fabianaron Latese Dufault, PharmD, BCPS Clinical pharmacist, pager [phone] 12/06/2016 3:57 AM

## 2016-12-06 NOTE — ED Notes (Signed)
Patient transported to MRI 

## 2016-12-06 NOTE — Progress Notes (Signed)
Orthopedic Trauma Service Progress Note    Subjective:  Follow up on synovial fluid analysis shows WBC count around 52,000, no organisms noted on gram stain   Remains afebrile  No elevated WBC count on serum eval   ROS As above  Objective:   VITALS:   Vitals:   12/06/16 0530 12/06/16 0534 12/06/16 0611 12/06/16 1351  BP: 115/76  113/60 109/65  Pulse: 82  93 89  Resp:   16 18  Temp:  98.8 F (37.1 C) 99.3 F (37.4 C) 98.3 F (36.8 C)  TempSrc:  Oral Oral   SpO2: 99%  98% 100%  Weight:   64.4 kg (142 lb)   Height:   5\' 10"  (1.778 m)     Intake/Output      05/17 0701 - 05/18 0700 05/18 0701 - 05/19 0700   IV Piggyback 200    Total Intake(mL/kg) 200 (3.1)    Urine (mL/kg/hr) 1400    Total Output 1400     Net -1200          Urine Occurrence  1 x     LABS  Results for orders placed or performed during the hospital encounter of 12/05/16 (from the past 24 hour(s))  CBC with Differential     Status: Abnormal   Collection Time: 12/05/16 11:53 PM  Result Value Ref Range   WBC 7.7 4.0 - 10.5 K/uL   RBC 3.95 (L) 4.22 - 5.81 MIL/uL   Hemoglobin 11.8 (L) 13.0 - 17.0 g/dL   HCT 29.535.3 (L) 62.139.0 - 30.852.0 %   MCV 89.4 78.0 - 100.0 fL   MCH 29.9 26.0 - 34.0 pg   MCHC 33.4 30.0 - 36.0 g/dL   RDW 65.712.7 84.611.5 - 96.215.5 %   Platelets 248 150 - 400 K/uL   Neutrophils Relative % 61 %   Neutro Abs 4.8 1.7 - 7.7 K/uL   Lymphocytes Relative 25 %   Lymphs Abs 1.9 0.7 - 4.0 K/uL   Monocytes Relative 11 %   Monocytes Absolute 0.8 0.1 - 1.0 K/uL   Eosinophils Relative 3 %   Eosinophils Absolute 0.2 0.0 - 0.7 K/uL   Basophils Relative 0 %   Basophils Absolute 0.0 0.0 - 0.1 K/uL  Basic metabolic panel     Status: Abnormal   Collection Time: 12/05/16 11:53 PM  Result Value Ref Range   Sodium 137 135 - 145 mmol/L   Potassium 4.3 3.5 - 5.1 mmol/L   Chloride 104 101 - 111 mmol/L   CO2 26 22 - 32 mmol/L   Glucose, Bld 94 65 - 99 mg/dL   BUN <5 (L) 6 - 20 mg/dL   Creatinine, Ser 9.520.59 (L) 0.61 - 1.24 mg/dL   Calcium 9.1 8.9 - 84.110.3 mg/dL   GFR calc non Af Amer >60 >60 mL/min   GFR calc Af Amer >60 >60 mL/min   Anion gap 7 5 - 15  I-Stat CG4 Lactic Acid, ED     Status: None   Collection Time: 12/06/16 12:33 AM  Result Value Ref Range   Lactic Acid, Venous 1.41 0.5 - 1.9 mmol/L  HIV antibody (Routine Testing)     Status: None   Collection Time: 12/06/16  6:34 AM  Result Value Ref Range   HIV Screen 4th Generation wRfx Non Reactive Non Reactive  Synovial cell count + diff, w/ crystals     Status: Abnormal   Collection Time: 12/06/16  1:04 PM  Result Value Ref Range   Color,  Synovial AMBER (A) YELLOW   Appearance-Synovial TURBID (A) CLEAR   Crystals, Fluid NO CRYSTALS SEEN    WBC, Synovial 52,400 (H) 0 - 200 /cu mm   Neutrophil, Synovial 80 (H) 0 - 25 %   Lymphocytes-Synovial Fld 5 0 - 20 %   Monocyte-Macrophage-Synovial Fluid 15 (L) 50 - 90 %   Eosinophils-Synovial 0 0 - 1 %  Gram stain     Status: None   Collection Time: 12/06/16  1:04 PM  Result Value Ref Range   Specimen Description FLUID RIGHT ANKLE    Special Requests NONE    Gram Stain      FEW WBC PRESENT, PREDOMINANTLY PMN NO ORGANISMS SEEN    Report Status 12/06/2016 FINAL      PHYSICAL EXAM:   See procedure note   Assessment/Plan:     Principal Problem:   Synovitis of right ankle Active Problems:   Heroin abuse   Polysubstance abuse   Anti-infectives    Start     Dose/Rate Route Frequency Ordered Stop   12/06/16 1400  vancomycin (VANCOCIN) IVPB 1000 mg/200 mL premix     1,000 mg 200 mL/hr over 60 Minutes Intravenous Every 8 hours 12/06/16 0400     12/06/16 0345  vancomycin (VANCOCIN) IVPB 1000 mg/200 mL premix     1,000 mg 200 mL/hr over 60 Minutes Intravenous  Once 12/06/16 0335 12/06/16 0454    .  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

## 2016-12-06 NOTE — H&P (Signed)
History and Physical    David [name] UJW:[id number] DOB: 1980/10/01 DOA: 12/05/2016  PCP: Patient, No Pcp Per  Patient coming from: Home  I have personally briefly reviewed patient's old medical records in [facility]  Chief Complaint: Ankle swelling  HPI: David [name] is a 36 y.o. male with medical history significant of IVDU including heroin and amphetamine abuse, last use yesterday.  Patient presents to the ED with c/o R ankle pain and swelling.  Symptoms ongoing for about 1.5 weeks.  Waxing and waning throughout that time.  He went to [facility] yesterday where they saw him and recommended he go to [facility] ED for MRI of ankle.  He instead went home for the day and is just now showing up in the ED at [facility] nearly 24 hours later.   ED Course: MRI shows synovitis of the L ankle.   Review of Systems: As per HPI otherwise 10 point review of systems negative.   Past Medical History:  Diagnosis Date  . Dialysis care    no longer in dialysis  . Hepatitis C antibody positive in blood   . Heroin abuse   . Kidney stones   . Polysubstance abuse     Past Surgical History:  Procedure Laterality Date  . I&D EXTREMITY Left 09/07/2013   Procedure: IRRIGATION AND DEBRIDEMENT EXTREMITY;  Surgeon: Dominica Severin, MD;  Location: [facility] OR;  Service: Orthopedics;  Laterality: Left;     reports that he has been smoking Cigarettes.  He has been smoking about 1.00 pack per day. He has never used smokeless tobacco. He reports that he drinks alcohol. He reports that he uses drugs, including Marijuana.  No Known Allergies  Family History  Problem Relation Age of Onset  . Diabetes type II Mother   . Drug abuse Father      Prior to Admission medications   Not on File    Physical Exam: Vitals:   12/06/16 0300 12/06/16 0330 12/06/16 0400 12/06/16 0430  BP: 118/83 124/86 118/84 127/87  Pulse: 84 80 77 77  Resp:      Temp:      TempSrc:      SpO2: 99% 100% 99% 99%    Constitutional: NAD,  calm, comfortable Eyes: PERRL, lids and conjunctivae normal ENMT: Mucous membranes are [name]. Posterior pharynx clear of any exudate or lesions.Normal dentition.  Neck: normal, supple, no masses, no thyromegaly Respiratory: clear to auscultation bilaterally, no wheezing, no crackles. Normal respiratory effort. No accessory muscle use.  Cardiovascular: Regular rate and rhythm, no murmurs / rubs / gallops. No extremity edema. 2+ pedal pulses. No carotid bruits.  Abdomen: no tenderness, no masses palpated. No hepatosplenomegaly. Bowel sounds positive.  Musculoskeletal: R ankle swollen and tender Skin: no rashes, lesions, ulcers. No induration Neurologic: CN 2-12 grossly intact. Sensation intact, DTR normal. Strength 5/5 in all 4.  Psychiatric: Normal judgment and insight. Alert and oriented x 3. Normal mood.    Labs on Admission: I have personally reviewed following labs and imaging studies  CBC:  Recent Labs Lab 12/05/16 0353 12/05/16 2353  WBC 6.8 7.7  NEUTROABS 3.4 4.8  HGB 11.1* 11.8*  HCT 33.6* 35.3*  MCV 92.3 89.4  PLT 252 248   Basic Metabolic Panel:  Recent Labs Lab 12/05/16 2353  NA 137  K 4.3  CL 104  CO2 26  GLUCOSE 94  BUN <5*  CREATININE 0.59*  CALCIUM 9.1   GFR: Estimated Creatinine Clearance: 122.8 mL/min (A) (by C-G formula based on  SCr of 0.59 mg/dL (L)). Liver Function Tests: No results for input(s): AST, ALT, ALKPHOS, BILITOT, PROT, ALBUMIN in the last 168 hours. No results for input(s): LIPASE, AMYLASE in the last 168 hours. No results for input(s): AMMONIA in the last 168 hours. Coagulation Profile: No results for input(s): INR, PROTIME in the last 168 hours. Cardiac Enzymes: No results for input(s): CKTOTAL, CKMB, CKMBINDEX, TROPONINI in the last 168 hours. BNP (last 3 results) No results for input(s): PROBNP in the last 8760 hours. HbA1C: No results for input(s): HGBA1C in the last 72 hours. CBG: No results for input(s): GLUCAP in the last  168 hours. Lipid Profile: No results for input(s): CHOL, HDL, LDLCALC, TRIG, CHOLHDL, LDLDIRECT in the last 72 hours. Thyroid Function Tests: No results for input(s): TSH, T4TOTAL, FREET4, T3FREE, THYROIDAB in the last 72 hours. Anemia Panel: No results for input(s): VITAMINB12, FOLATE, FERRITIN, TIBC, IRON, RETICCTPCT in the last 72 hours. Urine analysis:    Component Value Date/Time   COLORURINE YELLOW 06/29/2007 0150   APPEARANCEUR CLOUDY (A) 06/29/2007 0150   LABSPEC 1.024 06/29/2007 0150   PHURINE 6.5 06/29/2007 0150   GLUCOSEU NEGATIVE 06/29/2007 0150   HGBUR NEGATIVE 06/29/2007 0150   BILIRUBINUR NEGATIVE 06/29/2007 0150   KETONESUR NEGATIVE 06/29/2007 0150   PROTEINUR NEGATIVE 06/29/2007 0150   UROBILINOGEN 1.0 06/29/2007 0150   NITRITE NEGATIVE 06/29/2007 0150   LEUKOCYTESUR  06/29/2007 0150    NEGATIVE MICROSCOPIC NOT DONE ON URINES WITH NEGATIVE PROTEIN, BLOOD, LEUKOCYTES, NITRITE, OR GLUCOSE <1000 mg/dL.    Radiological Exams on Admission: Dg Ankle Complete Right  Result Date: 12/05/2016 CLINICAL DATA:  Right ankle pain for 2 weeks. Swelling. No known injury. EXAM: RIGHT ANKLE - COMPLETE 3+ VIEW COMPARISON:  None. FINDINGS: There is no evidence of fracture or dislocation. There is no evidence of arthropathy or other focal bone abnormality. There is a small tibiotalar joint effusion. Mild lateral soft tissue edema. IMPRESSION: Soft tissue edema and small joint effusion.  No osseous abnormality. Electronically Signed   By: Rubye Oaks MoyerD.   On: 12/05/2016 03:16   Mr Ankle Right W Wo Contrast  Result Date: 12/06/2016 CLINICAL DATA:  Hepatitis-C and heroin abuser complaining of worsening right ankle swelling and pain onset 2 weeks ago. EXAM: MRI OF THE RIGHT ANKLE WITHOUT AND WITH CONTRAST TECHNIQUE: Multiplanar, multisequence MR imaging of the ankle was performed before and after the administration of intravenous contrast. CONTRAST:  15 cc MultiHance IV COMPARISON:   Radiographs from the same day FINDINGS: TENDONS Peroneal: Intact peroneus longus and peroneus brevis tendons. Posteromedial: Intact tibialis posterior, flexor hallucis longus and flexor digitorum longus tendons. Anterior: Intact tibialis anterior, extensor hallucis longus and extensor digitorum longus tendons. Achilles: Intact. Plantar Fascia: Intact. LIGAMENTS Lateral: Intact. Medial: Intact. CARTILAGE Ankle Joint: Small to moderate enhancing tibiotalar and small posterior subtalar joint effusion may be secondary to a synovitis. A septic arthritis is not entirely excluded though preservation of the cartilage within the ankle and subtalar joints would make this less likely. Subtalar Joints/Sinus Tarsi: Intact with small posterior subtalar joint effusion. Bones: Minimal reactive edema about the tibiotalar and subtalar joints. Other: Soft tissue edema about the malleoli and hindfoot. IMPRESSION: IMPRESSION Soft tissue edema consistent with cellulitis. Enhancing ankle and subtalar joint effusions are likely related to a synovitis and less likely septic arthritis given the preservation of the cartilage within the joint spaces. No drainable fluid collections to suggest abscess. No evidence of a pyomyositis. Electronically Signed   By: Tollie Eth  MoyerD.   On: 12/06/2016 03:30    EKG: Independently reviewed.  Assessment/Plan Principal Problem:   Synovitis of right ankle Active Problems:   Heroin abuse   Polysubstance abuse    1. Synovitis of R ankle - presumed to be infectious (septic arthritis) until proven otherwise in this IVDU patient who has h/o multiple skin and soft tissue infections, inflammatory markers are elevated 1. Empiric vanc 2. Call ortho in AM, possibly for joint tap 2. Heroin abuse - PRN norco for pain control of ankle  DVT prophylaxis: Heparin [place] Code Status: Full Family Communication: No family in room Disposition Plan: Home after admit Consults called: None Admission status: Admit  to inpatient - likely will need multiple days of ABx for presumed infectious synovitis.   Hillary BowGARDNER, David Doescher M. DO [facility] Pager [phone]  If 7AM-7PM, please contact day team taking care of patient [email or web address] Password [email or web address]  12/06/2016, 4:55 AM

## 2016-12-07 ENCOUNTER — Encounter (HOSPITAL_COMMUNITY)
Admission: EM | Disposition: A | Discharge status: Home / Self Care | Payer: Self-pay | Source: Home / Self Care | Attending: Internal Medicine

## 2016-12-07 ENCOUNTER — Inpatient Hospital Stay (HOSPITAL_COMMUNITY): Payer: Self-pay | Admitting: Certified Registered"

## 2016-12-07 ENCOUNTER — Encounter (HOSPITAL_COMMUNITY): Payer: Self-pay | Admitting: Certified Registered"

## 2016-12-07 ENCOUNTER — Inpatient Hospital Stay (HOSPITAL_COMMUNITY): Payer: Self-pay

## 2016-12-07 DIAGNOSIS — A499 Bacterial infection, unspecified: Secondary | ICD-10-CM

## 2016-12-07 DIAGNOSIS — M01X71 Direct infection of right ankle and foot in infectious and parasitic diseases classified elsewhere: Secondary | ICD-10-CM

## 2016-12-07 HISTORY — PX: I&D EXTREMITY: SHX5045

## 2016-12-07 LAB — CBC
HEMATOCRIT: 36.7 % — AB (ref 39.0–52.0)
Hemoglobin: 12.1 g/dL — ABNORMAL LOW (ref 13.0–17.0)
MCH: 29.2 pg (ref 26.0–34.0)
MCHC: 33 g/dL (ref 30.0–36.0)
MCV: 88.6 fL (ref 78.0–100.0)
Platelets: 302 10*3/uL (ref 150–400)
RBC: 4.14 MIL/uL — AB (ref 4.22–5.81)
RDW: 12.3 % (ref 11.5–15.5)
WBC: 7.6 10*3/uL (ref 4.0–10.5)

## 2016-12-07 LAB — URINALYSIS, ROUTINE W REFLEX MICROSCOPIC
Glucose, UA: NEGATIVE mg/dL
HGB URINE DIPSTICK: NEGATIVE
Ketones, ur: NEGATIVE mg/dL
LEUKOCYTES UA: NEGATIVE
Nitrite: NEGATIVE
Protein, ur: 30 mg/dL — AB
SQUAMOUS EPITHELIAL / LPF: NONE SEEN
Specific Gravity, Urine: 1.032 — ABNORMAL HIGH (ref 1.005–1.030)
pH: 6 (ref 5.0–8.0)

## 2016-12-07 LAB — BASIC METABOLIC PANEL
ANION GAP: 8 (ref 5–15)
BUN: 12 mg/dL (ref 6–20)
CO2: 26 mmol/L (ref 22–32)
Calcium: 9.3 mg/dL (ref 8.9–10.3)
Chloride: 103 mmol/L (ref 101–111)
Creatinine, Ser: 0.67 mg/dL (ref 0.61–1.24)
GFR calc Af Amer: >60 mL/min (ref >60)
GFR calc non Af Amer: >60 mL/min (ref >60)
GLUCOSE: 92 mg/dL (ref 65–99)
POTASSIUM: 4 mmol/L (ref 3.5–5.1)
Sodium: 137 mmol/L (ref 135–145)

## 2016-12-07 LAB — RAPID URINE DRUG SCREEN, HOSP PERFORMED
Amphetamines: POSITIVE — AB
Barbiturates: NOT DETECTED
Benzodiazepines: NOT DETECTED
Cocaine: NOT DETECTED
OPIATES: POSITIVE — AB
TETRAHYDROCANNABINOL: POSITIVE — AB

## 2016-12-07 SURGERY — IRRIGATION AND DEBRIDEMENT EXTREMITY
Anesthesia: General | Site: Leg Lower | Laterality: Right

## 2016-12-07 MED ORDER — FENTANYL CITRATE (PF) 250 MCG/5ML IJ SOLN
INTRAMUSCULAR | Status: AC
  Filled 2016-12-07: qty 5

## 2016-12-07 MED ORDER — VANCOMYCIN HCL 500 MG IV SOLR
INTRAVENOUS | Status: AC
  Filled 2016-12-07: qty 500

## 2016-12-07 MED ORDER — MIDAZOLAM HCL 2 MG/2ML IJ SOLN
INTRAMUSCULAR | Status: AC
  Filled 2016-12-07: qty 2

## 2016-12-07 MED ORDER — SUGAMMADEX SODIUM 500 MG/5ML IV SOLN
INTRAVENOUS | Status: AC
  Filled 2016-12-07: qty 5

## 2016-12-07 MED ORDER — TOBRAMYCIN SULFATE 80 MG/2ML IJ SOLN
INTRAMUSCULAR | Status: DC | PRN
  Administered 2016-12-07: 240 mg

## 2016-12-07 MED ORDER — DEXTROSE 5 % IV SOLN
2.0000 g | INTRAVENOUS | Status: DC
  Administered 2016-12-07 – 2016-12-08 (×2): 2 g via INTRAVENOUS
  Filled 2016-12-07 (×3): qty 2

## 2016-12-07 MED ORDER — LACTATED RINGERS IV SOLN
INTRAVENOUS | Status: DC
  Administered 2016-12-07: 10:00:00 via INTRAVENOUS

## 2016-12-07 MED ORDER — HYDROMORPHONE HCL 1 MG/ML IJ SOLN
INTRAMUSCULAR | Status: AC
  Administered 2016-12-07: 0.5 mg
  Filled 2016-12-07: qty 0.5

## 2016-12-07 MED ORDER — ONDANSETRON HCL 4 MG/2ML IJ SOLN
INTRAMUSCULAR | Status: AC
  Filled 2016-12-07: qty 2

## 2016-12-07 MED ORDER — PROPOFOL 10 MG/ML IV BOLUS
INTRAVENOUS | Status: DC | PRN
  Administered 2016-12-07: 30 mg via INTRAVENOUS
  Administered 2016-12-07: 140 mg via INTRAVENOUS

## 2016-12-07 MED ORDER — HYDROMORPHONE HCL 1 MG/ML IJ SOLN
0.2500 mg | INTRAMUSCULAR | Status: DC | PRN
  Administered 2016-12-07 (×2): 0.5 mg via INTRAVENOUS

## 2016-12-07 MED ORDER — PROPOFOL 10 MG/ML IV BOLUS
INTRAVENOUS | Status: AC
  Filled 2016-12-07: qty 20

## 2016-12-07 MED ORDER — OXYCODONE HCL 5 MG PO TABS
ORAL_TABLET | ORAL | Status: AC
  Filled 2016-12-07: qty 1

## 2016-12-07 MED ORDER — OXYCODONE HCL 5 MG PO TABS
5.0000 mg | ORAL_TABLET | Freq: Once | ORAL | Status: AC | PRN
  Administered 2016-12-07: 5 mg via ORAL

## 2016-12-07 MED ORDER — MIDAZOLAM HCL 2 MG/2ML IJ SOLN
INTRAMUSCULAR | Status: DC | PRN
  Administered 2016-12-07: 2 mg via INTRAVENOUS

## 2016-12-07 MED ORDER — ROCURONIUM BROMIDE 10 MG/ML (PF) SYRINGE
PREFILLED_SYRINGE | INTRAVENOUS | Status: AC
  Filled 2016-12-07: qty 5

## 2016-12-07 MED ORDER — LIDOCAINE 2% (20 MG/ML) 5 ML SYRINGE
INTRAMUSCULAR | Status: AC
  Filled 2016-12-07: qty 5

## 2016-12-07 MED ORDER — VANCOMYCIN HCL 500 MG IV SOLR
INTRAVENOUS | Status: DC | PRN
  Administered 2016-12-07: 500 mg

## 2016-12-07 MED ORDER — TOBRAMYCIN SULFATE 80 MG/2ML IJ SOLN
INTRAMUSCULAR | Status: AC
  Filled 2016-12-07: qty 2

## 2016-12-07 MED ORDER — HYDROMORPHONE HCL 1 MG/ML IJ SOLN
INTRAMUSCULAR | Status: AC
  Filled 2016-12-07: qty 0.5

## 2016-12-07 MED ORDER — SUGAMMADEX SODIUM 200 MG/2ML IV SOLN
INTRAVENOUS | Status: DC | PRN
  Administered 2016-12-07: 150 mg via INTRAVENOUS

## 2016-12-07 MED ORDER — HYDROMORPHONE HCL 1 MG/ML IJ SOLN
INTRAMUSCULAR | Status: AC
  Administered 2016-12-07: 0.5 mg via INTRAVENOUS
  Filled 2016-12-07: qty 0.5

## 2016-12-07 MED ORDER — LIDOCAINE HCL (CARDIAC) 20 MG/ML IV SOLN
INTRAVENOUS | Status: DC | PRN
  Administered 2016-12-07: 60 mg via INTRAVENOUS

## 2016-12-07 MED ORDER — LEVOFLOXACIN IN D5W 750 MG/150ML IV SOLN
750.0000 mg | Freq: Once | INTRAVENOUS | Status: AC
  Administered 2016-12-07: 750 mg via INTRAVENOUS
  Filled 2016-12-07: qty 150

## 2016-12-07 MED ORDER — SUGAMMADEX SODIUM 200 MG/2ML IV SOLN
INTRAVENOUS | Status: AC
  Filled 2016-12-07: qty 2

## 2016-12-07 MED ORDER — LORAZEPAM 2 MG/ML IJ SOLN: 0.5000 mg | Freq: Four times a day (QID) | INTRAMUSCULAR | Status: DC | PRN

## 2016-12-07 MED ORDER — OXYCODONE HCL 5 MG/5ML PO SOLN: 5.0000 mg | Freq: Once | ORAL | Status: AC | PRN

## 2016-12-07 MED ORDER — FENTANYL CITRATE (PF) 100 MCG/2ML IJ SOLN
INTRAMUSCULAR | Status: DC | PRN
  Administered 2016-12-07: 100 ug via INTRAVENOUS
  Administered 2016-12-07 (×3): 50 ug via INTRAVENOUS

## 2016-12-07 MED ORDER — ONDANSETRON HCL 4 MG/2ML IJ SOLN
INTRAMUSCULAR | Status: DC | PRN
  Administered 2016-12-07: 4 mg via INTRAVENOUS

## 2016-12-07 MED ORDER — SODIUM CHLORIDE 0.9 % IR SOLN
Status: DC | PRN
  Administered 2016-12-07: 3000 mL

## 2016-12-07 MED ORDER — 0.9 % SODIUM CHLORIDE (POUR BTL) OPTIME
TOPICAL | Status: DC | PRN
  Administered 2016-12-07: 1000 mL

## 2016-12-07 MED ORDER — ROCURONIUM BROMIDE 100 MG/10ML IV SOLN
INTRAVENOUS | Status: DC | PRN
  Administered 2016-12-07: 20 mg via INTRAVENOUS
  Administered 2016-12-07: 50 mg via INTRAVENOUS

## 2016-12-07 SURGICAL SUPPLY — 47 items
BANDAGE ELASTIC 4 VELCRO ST LF (GAUZE/BANDAGES/DRESSINGS) ×2 IMPLANT
BNDG COHESIVE 4X5 TAN STRL (GAUZE/BANDAGES/DRESSINGS) ×3 IMPLANT
BNDG GAUZE ELAST 4 BULKY (GAUZE/BANDAGES/DRESSINGS) ×4 IMPLANT
BNDG GAUZE STRTCH 6 (GAUZE/BANDAGES/DRESSINGS) ×9 IMPLANT
BRUSH SCRUB SURG 4.25 DISP (MISCELLANEOUS) ×6 IMPLANT
COVER SURGICAL LIGHT HANDLE (MISCELLANEOUS) ×6 IMPLANT
DRAPE U-SHAPE 47X51 STRL (DRAPES) ×3 IMPLANT
DRSG ADAPTIC 3X8 NADH LF (GAUZE/BANDAGES/DRESSINGS) ×3 IMPLANT
ELECT REM PT RETURN 9FT ADLT (ELECTROSURGICAL)
ELECTRODE REM PT RTRN 9FT ADLT (ELECTROSURGICAL) IMPLANT
GAUZE SPONGE 4X4 12PLY STRL (GAUZE/BANDAGES/DRESSINGS) ×3 IMPLANT
GAUZE SPONGE 4X4 12PLY STRL LF (GAUZE/BANDAGES/DRESSINGS) ×2 IMPLANT
GLOVE BIO SURGEON STRL SZ7.5 (GLOVE) ×3 IMPLANT
GLOVE BIO SURGEON STRL SZ8 (GLOVE) ×3 IMPLANT
GLOVE BIOGEL PI IND STRL 7.5 (GLOVE) ×1 IMPLANT
GLOVE BIOGEL PI IND STRL 8 (GLOVE) ×1 IMPLANT
GLOVE BIOGEL PI INDICATOR 7.5 (GLOVE) ×2
GLOVE BIOGEL PI INDICATOR 8 (GLOVE) ×2
GOWN STRL REUS W/ TWL LRG LVL3 (GOWN DISPOSABLE) ×2 IMPLANT
GOWN STRL REUS W/ TWL XL LVL3 (GOWN DISPOSABLE) ×1 IMPLANT
GOWN STRL REUS W/TWL LRG LVL3 (GOWN DISPOSABLE) ×6
GOWN STRL REUS W/TWL XL LVL3 (GOWN DISPOSABLE) ×3
HANDPIECE INTERPULSE COAX TIP (DISPOSABLE)
KIT BASIN OR (CUSTOM PROCEDURE TRAY) ×3 IMPLANT
KIT ROOM TURNOVER OR (KITS) ×3 IMPLANT
KIT STIMULAN RAPID CURE  10CC (Orthopedic Implant) ×2 IMPLANT
KIT STIMULAN RAPID CURE 10CC (Orthopedic Implant) IMPLANT
MANIFOLD NEPTUNE II (INSTRUMENTS) ×3 IMPLANT
NS IRRIG 1000ML POUR BTL (IV SOLUTION) ×3 IMPLANT
PACK ORTHO EXTREMITY (CUSTOM PROCEDURE TRAY) ×3 IMPLANT
PAD ARMBOARD 7.5X6 YLW CONV (MISCELLANEOUS) ×6 IMPLANT
PAD CAST 4YDX4 CTTN HI CHSV (CAST SUPPLIES) IMPLANT
PADDING CAST COTTON 4X4 STRL (CAST SUPPLIES) ×3
PADDING CAST COTTON 6X4 STRL (CAST SUPPLIES) ×3 IMPLANT
SET HNDPC FAN SPRY TIP SCT (DISPOSABLE) IMPLANT
SPONGE LAP 18X18 X RAY DECT (DISPOSABLE) ×3 IMPLANT
STOCKINETTE IMPERVIOUS 9X36 MD (GAUZE/BANDAGES/DRESSINGS) ×3 IMPLANT
SUT ETHILON 2 0 FS 18 (SUTURE) ×2 IMPLANT
SUT PDS AB 2-0 CT1 27 (SUTURE) ×2 IMPLANT
SWAB CULTURE ESWAB REG 1ML (MISCELLANEOUS) IMPLANT
TOWEL OR 17X24 6PK STRL BLUE (TOWEL DISPOSABLE) ×3 IMPLANT
TOWEL OR 17X26 10 PK STRL BLUE (TOWEL DISPOSABLE) ×6 IMPLANT
TUBE CONNECTING 12'X1/4 (SUCTIONS) ×1
TUBE CONNECTING 12X1/4 (SUCTIONS) ×2 IMPLANT
UNDERPAD 30X30 (UNDERPADS AND DIAPERS) ×3 IMPLANT
WATER STERILE IRR 1000ML POUR (IV SOLUTION) ×3 IMPLANT
YANKAUER SUCT BULB TIP NO VENT (SUCTIONS) ×3 IMPLANT

## 2016-12-07 NOTE — Brief Op Note (Addendum)
12/05/2016 - 12/07/2016  12:08 PM  PATIENT:  David [name]  36 y.o. male  PRE-OPERATIVE DIAGNOSIS:  RIGHT ANKLE INFECTION  POST-OPERATIVE DIAGNOSIS:  RIGHT ANKLE INFECTION  PROCEDURE:  Procedure(s): 1. OPEN ARTHROTOMY WITH PARTIAL SYNOVECTOMY AND DEBRIDEMENT ANKLE (Right) 2. PLACEMENT OF ANTIBIOTIC BEADS [3. TRANSESOPHOGEAL ECHOCARDIOGRAM--NO VEGETATIONS SEEN ON VALVES by Dr. Thayer Ohmhris Moser]  SURGEON:  Surgeon(s) and Role:    * [name], Farrell Pantaleo, MD - Primary  PHYSICIAN ASSISTANT: NONE  ANESTHESIA:   general  EBL:  Total I/O In: 500 [I.V.:500] Out: 20 [Blood:20]  BLOOD ADMINISTERED:none  DRAINS: Hemovac drain   LOCAL MEDICATIONS USED:  NONE  SPECIMEN:  Source of Specimen:  RIGHT ANKLE SYNOVIAL FLUID  DISPOSITION OF SPECIMEN:  MICRO  COUNTS:  YES  TOURNIQUET:  * No tourniquets in log *  DICTATION: .Other Dictation: Dictation Number [id number]  PLAN OF CARE: Admit to inpatient   PATIENT DISPOSITION:  PACU - hemodynamically stable.   Delay start of Pharmacological VTE agent (>24hrs) due to surgical blood loss or risk of bleeding: no

## 2016-12-07 NOTE — Transfer of Care (Signed)
Immediate Anesthesia Transfer of Care Note  Patient: David SchlatterRichard [name]  Procedure(s) Performed: Procedure(s): IRRIGATION AND DEBRIDEMENT ANKLE (Right)  Patient Location: PACU  Anesthesia Type:General  Level of Consciousness: awake  Airway & Oxygen Therapy: Patient Spontanous Breathing  Post-op Assessment: Report given to RN and Patient moving all extremities X 4  Post vital signs: Reviewed and stable  Last Vitals:  Vitals:   12/06/16 1351 12/07/16 0625  BP: 109/65 (!) 102/59  Pulse: 89 75  Resp: 18 20  Temp: 36.8 C 36.6 C    Last Pain:  Vitals:   12/07/16 0647  TempSrc:   PainSc: 7       Patients Stated Pain Goal: 2 (12/07/16 [id number])  Complications: No apparent anesthesia complications

## 2016-12-07 NOTE — Op Note (Signed)
NAME:  David [name], Gillis            ACCOUNT NO.:  [id number]  MEDICAL RECORD NO.:  [id number]  LOCATION:  [facility]                         FACILITY:  [facility]  PHYSICIAN:  Doralee AlbinoMichael H. Carola FrostHandy, M.D. DATE OF BIRTH:  1981/04/14  DATE OF PROCEDURE:  12/07/2016 DATE OF DISCHARGE:                              OPERATIVE REPORT   PREOPERATIVE DIAGNOSIS:  Right ankle septic arthritis.  POSTOPERATIVE DIAGNOSES:  Right ankle septic arthritis.  PROCEDURES: 1. Open arthrotomy and partial synovectomy of the right ankle with     irrigation and drainage. 2. Placement of antibiotic beads using vancomycin and tobramycin. [3. TRANSESOPHOGEAL ECHOCARDIOGRAM performed by Anesth, Dr. Corky Soxhris Moser, as well--NO VEGETATIONS SEEN ON VALVES]  SURGEON:  Doralee AlbinoMichael H. Carola FrostHandy, M.D.  ASSISTANT:  None.  ANESTHESIA:  General.  COMPLICATIONS:  None.  TOURNIQUET:  None.  DRAINS:  One Hemovac drain in the ankle.  SPECIMENS:  Two anaerobic aerobic cultures sent from the synovial fluid to microbiology.  DISPOSITION:  To PACU.  CONDITION:  Stable.  BRIEF SUMMARY AND INDICATIONS FOR PROCEDURE:  David [name] is a 36- year-old male hep C patient and IV drug abuser in addition to meth and other recreational drugs, who presents with a 2-week history of right ankle pain and swelling.  The patient was actually in the process of being treated 2 days ago, and was transferred from one hospital to the other for surgical debridement when he failed to show.  He yesterday underwent aspiration, which revealed a cell count of 50,000 white cells and not a convincing appearance of infection, but rather suspected inflammation of the inflammatory arthropathy of the ankle because of his rather exceptional range of motion without discomfort in the absence of any local erythema.  Cultures, however returned positive for gram- negative rods today and consequently, decision was made to proceed emergently to the OR.  I did discuss with the  patient risks and benefits of surgical debridement including the potential failure to alleviate the infection, need for further surgery, potential for reinfection, particularly, he continues current social habits, heart attack, stroke, anesthetic reactions, antibiotic reactions, and others.  After full discussion, the patient did wish to proceed.  We specifically discussed antibiotic beads and the need to remain nonweightbearing with some form of splinting or brace wear for the next few weeks.  BRIEF SUMMARY OF PROCEDURE:  The patient was already on vancomycin, he did not receive any other drugs prior to surgery.  General anesthesia was induced.  His right lower extremity was prepped and draped in usual sterile fashion, no tourniquet was used during the procedure.  After time-out, a 2 cm incision was made over the anteromedial ankle. Dissection was carried carefully down to the joint when arthrotomy was made.  Immediate egress of viscous, but not frankly purulent synovial fluid began to run out of the ankle, some of this was cultured and sent to micro.  The entirety of the fluid was evacuated.  The joint was copiously irrigated.  Then, I performed a stepwise synovectomy from the medial and lateral aspects of the joint anteriorly.  The ankle was irrigated thoroughly once more taking it through a full range of motion during this process to facilitate removal.  I  then placed antibiotic beads using STIMULAN and vancomycin and tobramycin as well as a medium Hemovac drain.  I placed 1 PDS suture and 2 nylon sutures at the skin. Sterile gently compressive dressing was applied.  The patient will receive an off-the-shelf brace in the recovery room as we anticipate removal of the drain and wound check in 2 days with the possibility of return to the OR versus continued medical management.  PROGNOSIS:  Again wound surveillance will be as above with possibility return to the OR early next week.  He  is elevated risk for persistent infection.  We are hopeful we could find an oral antibiotic given his IV drug use and the difficulty that having a PICC line or other direct IV access could appose to his health going forward.  Risk for arthritis is significantly elevated as well.     Doralee Albino. Carola Frost, M.D.     MHH/MEDQ  D:  12/07/2016  T:  12/07/2016  Job:  [id number]

## 2016-12-07 NOTE — Progress Notes (Signed)
[facility] PROGRESS NOTE  David [name] RWE:[id number] DOB: 1981/05/21 DOA: 12/05/2016 PCP: Patient, No Pcp Per  Interim summary and HPI 35 year old male with medical history significant for IV drug use including hearing and amphetamine. Who presented to the emergency department complaining of right ankle pain and swelling. Symptoms has been present for approximately 1.5 week and worsening. Patient denies any trauma or injury to his joint.  Assessment/Plan: 1-synovitis/right ankle septic arthritis: Due to gram-negative rods microorganism -Patient initially covered with vancomycin on admission -Joint was tapped by orthopedic service and synovial fluid growing gram-negative rods. -At this moment following culture vancomycin will be discontinue and the patient will be started on Rocephin -Patient is also status post open arthrotomy with a partial synovitis to me and ankle debridement. -Will follow recommendations from orthopedic service. -Continue as needed pain medication -Patient is no septic, there is no fever and he has normal WBCs. -Elevated ESR and CRP (54 and 5.1 respectively)  2-heroine abuse/with mild withdrawal -Cessation counseling provided -Will use when necessary morphine/Vicodin, also PRN clonidine, Phenergan and Ativan for withdrawal symptoms.  3-hx of Hep C -positive PCR qualitive -wi ll check quantification    Code Status: Full code Family Communication: No family at bedside Disposition Plan: To be determined. Continue IV antibiotics; continue supportive care and medication therapy to prevent/avoid withdrawals.   Consultants:  Dr. Marcelino Scot (orthopedic service).  Procedures: OPEN ARTHROTOMY WITH PARTIAL SYNOVECTOMY AND DEBRIDEMENT ANKLE (Right) 12/07/16  Antibiotics:  Vancomycin 5/18>>>5/19  Rocephin 5/19  HPI/Subjective: Afebrile, denies chest pain and shortness of breath. Patient is anxious, restless, sweating and feeling  nauseous.  Objective: Vitals:   12/07/16 1145 12/07/16 1215  BP: (!) 126/98   Pulse: 88 81  Resp: 15 (!) 24  Temp: 97.7 F (36.5 C) 97.7 F (36.5 C)    Intake/Output Summary (Last 24 hours) at 12/07/16 1517 Last data filed at 12/07/16 1145  Gross per 24 hour  Intake             1460 ml  Output              420 ml  Net             1040 ml   Filed Weights   12/06/16 0611  Weight: 64.4 kg (142 lb)    Exam:   General:  Afebrile, no chest pain, no shortness of breath. Anxious, sweaty and restless; still complaining of right ankle pain.  Cardiovascular: S1 and S2, no rubs, no gallops, no murmur  Respiratory: Clear to auscultation bilaterally, slightly tachypneic; no wheezing, no crackles, no using accessory muscles.  Abdomen: Soft, nontender, nondistended  Musculoskeletal: Mild swelling on his right ankle, no superimposed erythema, some decrease in his range of motion and complaining of pain.  Data Reviewed: Basic Metabolic Panel:  Recent Labs Lab 12/05/16 2353 12/07/16 0357  NA 137 137  K 4.3 4.0  CL 104 103  CO2 26 26  GLUCOSE 94 92  BUN <5* 12  CREATININE 0.59* 0.67  CALCIUM 9.1 9.3   CBC:  Recent Labs Lab 12/05/16 0353 12/05/16 2353 12/07/16 0357  WBC 6.8 7.7 7.6  NEUTROABS 3.4 4.8  --   HGB 11.1* 11.8* 12.1*  HCT 33.6* 35.3* 36.7*  MCV 92.3 89.4 88.6  PLT 252 248 302   CBG: No results for input(s): GLUCAP in the last 168 hours.  Recent Results (from the past 240 hour(s))  Culture, body fluid-bottle     Status: None (Preliminary result)   Collection Time:  12/06/16  1:04 PM  Result Value Ref Range Status   Specimen Description FLUID RIGHT ANKLE  Final   Special Requests NONE  Final   Gram Stain   Final    GRAM NEGATIVE RODS IN BOTH AEROBIC AND ANAEROBIC BOTTLES CRITICAL RESULT CALLED TO, READ BACK BY AND VERIFIED WITH: T OOSIM,RN AT 0709 12/07/16 BY L BENFIELD    Culture GRAM NEGATIVE RODS  Final   Report Status PENDING  Incomplete  Gram  stain     Status: None   Collection Time: 12/06/16  1:04 PM  Result Value Ref Range Status   Specimen Description FLUID RIGHT ANKLE  Final   Special Requests NONE  Final   Gram Stain   Final    FEW WBC PRESENT, PREDOMINANTLY PMN NO ORGANISMS SEEN    Report Status 12/06/2016 FINAL  Final  Aerobic/Anaerobic Culture (surgical/deep wound)     Status: None (Preliminary result)   Collection Time: 12/07/16 11:33 AM  Result Value Ref Range Status   Specimen Description ABSCESS RIGHT ANKLE  Final   Special Requests NONE  Final   Gram Stain   Final    MODERATE WBC PRESENT, PREDOMINANTLY PMN NO ORGANISMS SEEN    Culture PENDING  Incomplete   Report Status PENDING  Incomplete     Studies: Mr Ankle Right W Wo Contrast  Result Date: 12/06/2016 CLINICAL DATA:  Hepatitis-C and heroin abuser complaining of worsening right ankle swelling and pain onset 2 weeks ago. EXAM: MRI OF THE RIGHT ANKLE WITHOUT AND WITH CONTRAST TECHNIQUE: Multiplanar, multisequence MR imaging of the ankle was performed before and after the administration of intravenous contrast. CONTRAST:  15 cc MultiHance IV COMPARISON:  Radiographs from the same day FINDINGS: TENDONS Peroneal: Intact peroneus longus and peroneus brevis tendons. Posteromedial: Intact tibialis posterior, flexor hallucis longus and flexor digitorum longus tendons. Anterior: Intact tibialis anterior, extensor hallucis longus and extensor digitorum longus tendons. Achilles: Intact. Plantar Fascia: Intact. LIGAMENTS Lateral: Intact. Medial: Intact. CARTILAGE Ankle Joint: Small to moderate enhancing tibiotalar and small posterior subtalar joint effusion may be secondary to a synovitis. A septic arthritis is not entirely excluded though preservation of the cartilage within the ankle and subtalar joints would make this less likely. Subtalar Joints/Sinus Tarsi: Intact with small posterior subtalar joint effusion. Bones: Minimal reactive edema about the tibiotalar and  subtalar joints. Other: Soft tissue edema about the malleoli and hindfoot. IMPRESSION: IMPRESSION Soft tissue edema consistent with cellulitis. Enhancing ankle and subtalar joint effusions are likely related to a synovitis and less likely septic arthritis given the preservation of the cartilage within the joint spaces. No drainable fluid collections to suggest abscess. No evidence of a pyomyositis. Electronically Signed   By: Ashley Royalty M.D.   On: 12/06/2016 03:30    Scheduled Meds: . heparin  5,000 Units Subcutaneous Q8H  . HYDROmorphone      . ketorolac  10 mg Oral Q6H  . lidocaine  5 mL Intradermal Once  . oxyCODONE       Continuous Infusions: . cefTRIAXone (ROCEPHIN)  IV    . lactated ringers 10 mL/hr at 12/07/16 1003    Principal Problem:   Synovitis of right ankle Active Problems:   Heroin abuse   Polysubstance abuse    Time spent: 25 minutes    Barton Dubois  [facility] Pager [phone]. If 7PM-7AM, please contact night-coverage at [email or web address], password [facility] 12/07/2016, 3:17 PM  LOS: 1 day

## 2016-12-07 NOTE — Progress Notes (Addendum)
CRITICAL VALUE ALERT  Critical value received:  Rt ankle fluid now growing gram negative rod bacteria this AM. It did not grow any bacteria yesterday, per laboratory personnel.  Date of notification:  12/07/16  Time of notification:  7:10am  Critical value read back:Yes.    Nurse who received alert:  Rennis Goldenoyin Chairty Toman, RN  MD notified (1st page):  Gwenlyn PerkingMadera, MD  Time of first page:  7:56am  Care passed on to the day RN, Melanee SpryVicki S.

## 2016-12-07 NOTE — Anesthesia Procedure Notes (Addendum)
Procedure Name: Intubation Date/Time: 12/07/2016 10:40 AM Performed by: Sampson Si E Pre-anesthesia Checklist: Patient identified, Emergency Drugs available, Suction available and Patient being monitored Patient Re-evaluated:Patient Re-evaluated prior to inductionOxygen Delivery Method: Circle System Utilized Preoxygenation: Pre-oxygenation with 100% oxygen Intubation Type: IV induction Ventilation: Mask ventilation without difficulty Laryngoscope Size: Mac and 3 Grade View: Grade I Tube type: Oral Tube size: 7.5 mm Number of attempts: 1 Airway Equipment and Method: Stylet and Oral airway Placement Confirmation: ETT inserted through vocal cords under direct vision,  positive ETCO2 and breath sounds checked- equal and bilateral Secured at: 21 cm Tube secured with: Tape Dental Injury: Teeth and Oropharynx as per pre-operative assessment

## 2016-12-07 NOTE — Anesthesia Postprocedure Evaluation (Addendum)
Anesthesia Post Note  Patient: David SchlatterRichard [name]  Procedure(s) Performed: Procedure(s) (LRB): IRRIGATION AND DEBRIDEMENT ANKLE (Right)  Patient location during evaluation: PACU Anesthesia Type: General Level of consciousness: awake and alert Pain management: pain level controlled Vital Signs Assessment: post-procedure vital signs reviewed and stable Respiratory status: spontaneous breathing, nonlabored ventilation, respiratory function stable and patient connected to nasal cannula oxygen Cardiovascular status: stable Postop Assessment: no signs of nausea or vomiting Anesthetic complications: no       Last Vitals:  Vitals:   12/07/16 1145 12/07/16 1215  BP: (!) 126/98   Pulse: 88 81  Resp: 15 (!) 24  Temp: 36.5 C 36.5 C    Last Pain:  Vitals:   12/07/16 1215  TempSrc:   PainSc: 8                  Pessy Delamar

## 2016-12-07 NOTE — Anesthesia Preprocedure Evaluation (Addendum)
Anesthesia Evaluation  Patient identified by MRN, date of birth, ID band Patient awake    Reviewed: Allergy & Precautions, NPO status , Patient's Chart, lab work & pertinent test results  History of Anesthesia Complications Negative for: history of anesthetic complications  Airway Mallampati: I  TM Distance: >3 FB Neck ROM: Full    Dental  (+) Poor Dentition, Chipped, Dental Advisory Given, Teeth Intact,    Pulmonary neg shortness of breath, neg sleep apnea, neg COPD, neg recent URI, Current Smoker,    breath sounds clear to auscultation       Cardiovascular negative cardio ROS   Rhythm:Regular     Neuro/Psych negative neurological ROS  negative psych ROS   GI/Hepatic negative GI ROS, (+)     substance abuse  marijuana use, methamphetamine use and IV drug use,   Endo/Other  negative endocrine ROS  Renal/GU      Musculoskeletal  (+) Arthritis ,   Abdominal   Peds  Hematology  (+) anemia ,   Anesthesia Other Findings   Reproductive/Obstetrics                                                             Anesthesia Evaluation    Airway Mallampati: I       Dental  (+) Poor Dentition, Chipped, Dental Advisory Given, Teeth Intact,    Pulmonary Current Smoker,           Cardiovascular      Neuro/Psych    GI/Hepatic   Endo/Other    Renal/GU      Musculoskeletal   Abdominal   Peds  Hematology   Anesthesia Other Findings   Reproductive/Obstetrics                             Anesthesia Physical Anesthesia Plan Anesthesia Quick Evaluation  Anesthesia Physical Anesthesia Plan  ASA: II  Anesthesia Plan: General   Post-op Pain Management:    Induction: Intravenous  Airway Management Planned: Oral ETT  Additional Equipment: TEE  Intra-op Plan:   Post-operative Plan: Extubation in OR  Informed Consent: I have reviewed  the patients History and Physical, chart, labs and discussed the procedure including the risks, benefits and alternatives for the proposed anesthesia with the patient or authorized representative who has indicated his/her understanding and acceptance.   Dental advisory given  Plan Discussed with: CRNA and Surgeon  Anesthesia Plan Comments:         Anesthesia Quick Evaluation

## 2016-12-08 DIAGNOSIS — M25571 Pain in right ankle and joints of right foot: Secondary | ICD-10-CM

## 2016-12-08 DIAGNOSIS — Z720 Tobacco use: Secondary | ICD-10-CM

## 2016-12-08 DIAGNOSIS — F17210 Nicotine dependence, cigarettes, uncomplicated: Secondary | ICD-10-CM

## 2016-12-08 DIAGNOSIS — M00871 Arthritis due to other bacteria, right ankle and foot: Principal | ICD-10-CM

## 2016-12-08 DIAGNOSIS — B1920 Unspecified viral hepatitis C without hepatic coma: Secondary | ICD-10-CM

## 2016-12-08 DIAGNOSIS — Z813 Family history of other psychoactive substance abuse and dependence: Secondary | ICD-10-CM

## 2016-12-08 DIAGNOSIS — B9689 Other specified bacterial agents as the cause of diseases classified elsewhere: Secondary | ICD-10-CM

## 2016-12-08 DIAGNOSIS — Z833 Family history of diabetes mellitus: Secondary | ICD-10-CM

## 2016-12-08 LAB — HEPATITIS PANEL, ACUTE
HCV Ab: >11 s/co ratio — ABNORMAL HIGH (ref 0.0–0.9)
HEP A IGM: NEGATIVE
HEP B C IGM: NEGATIVE
Hepatitis B Surface Ag: POSITIVE — AB

## 2016-12-08 LAB — URINE CULTURE: CULTURE: NO GROWTH

## 2016-12-08 LAB — HCV RNA QUANT RFLX ULTRA OR GENOTYP
HCV RNA Qnt(log copy/mL): 1.477 log10 IU/mL
HEPATITIS C QUANTITATION: 30 [IU]/mL

## 2016-12-08 MED ORDER — NICOTINE 21 MG/24HR TD PT24
21.0000 mg | MEDICATED_PATCH | Freq: Every day | TRANSDERMAL | Status: DC
  Administered 2016-12-08 – 2016-12-09 (×2): 21 mg via TRANSDERMAL
  Filled 2016-12-08 (×2): qty 1

## 2016-12-08 NOTE — Consult Note (Signed)
[facility]       Reason for Consult: septic arthritis    Referring Physician: Dr. Carola FrostHandy  Principal Problem:   Synovitis of right ankle Active Problems:   Heroin abuse   Polysubstance abuse   . heparin  5,000 Units Subcutaneous Q8H  . ketorolac  10 mg Oral Q6H  . lidocaine  5 mL Intradermal Once  . 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  He is not a candidate for IV therapy and Serratia often sensitive to flouroquinolone which has good bioavailabitlity orally so likely will use that.   I discussed hepatitis C with him and we can treat him if he remains drug free for an extended period.  HIV negative  Dr. Drue SecondSnider on tomorrow for final recs  Antibiotics: ceftriaxone  HPI: David [name] is a 36 y.o. 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  Taken to OR by Dr. Carola FrostHandy yesterday who performed I and D.  Initial aspiration with Serratia.  No fever, no chills.  Had started about 2-3 weeks prior to presentation.  Also had a TEE performed during the surgery and no vegetation noted.  Started on vancomycin and ceftriaxone and narrowed to ceftriaxone yesterday.  He feels he wants to quit drugs but struggles with addiction.  Also hepatitis C positive in 2015 and repeat RNA sent today to confirm positivity.     Review of Systems:  Constitutional: negative for fevers, chills and malaise Gastrointestinal: negative for diarrhea Integument/breast: negative for rash All other systems reviewed and are negative    Past Medical History:  Diagnosis Date  . Dialysis care    no longer in dialysis  . Hepatitis C antibody positive in blood   . Heroin abuse   . Kidney stones   . Polysubstance abuse      Social History  Substance Use Topics  . Smoking status: Current Every Day Smoker    Packs/day: 1.00    Types: Cigarettes  . Smokeless tobacco: Never Used  . Alcohol use Yes     Comment: denies 07/07/16    Family History  Problem Relation Age of Onset  . Diabetes type II Mother   . Drug abuse Father     No Known Allergies  Physical Exam: Constitutional: in no apparent distress  Vitals:   12/07/16 2210 12/08/16 0458  BP: 109/62 99/88  Pulse: 75 78  Resp: 16 20  Temp: 98.4 F (36.9 C) 98 F (36.7 C)   EYES: anicteric ENMT: no thrush Cardiovascular: Cor RRR Respiratory: CTA B; normal respiratory effort GI: Bowel sounds are normal, liver is not enlarged, spleen is not enlarged, soft, nt Musculoskeletal: no pedal edema noted Skin: negatives: no rash Hematologic: no cervical lad  Lab Results  Component Value Date   WBC 7.6 12/07/2016   HGB 12.1 (L) 12/07/2016   HCT 36.7 (L) 12/07/2016   MCV 88.6 12/07/2016   PLT 302 12/07/2016    Lab Results  Component Value Date   CREATININE 0.67 12/07/2016   BUN 12 12/07/2016   NA 137 12/07/2016   K 4.0 12/07/2016   CL 103 12/07/2016   CO2 26 12/07/2016    Lab Results  Component Value Date   ALT 16 09/07/2013   AST  45 (H) 09/07/2013   ALKPHOS 128 (H) 09/07/2013     Microbiology: Recent Results (from the past 240 hour(s))  Culture, body fluid-bottle     Status: Abnormal (Preliminary result)   Collection Time: 12/06/16  1:04 PM  Result Value Ref Range Status   Specimen Description FLUID RIGHT ANKLE  Final   Special Requests NONE  Final   Gram Stain   Final    GRAM NEGATIVE RODS IN BOTH AEROBIC AND ANAEROBIC BOTTLES CRITICAL RESULT CALLED TO, READ BACK BY AND VERIFIED WITH: T OOSIM,RN AT 0709 12/07/16 BY L BENFIELD    Culture SERRATIA MARCESCENS SUSCEPTIBILITIES TO FOLLOW  (A)  Final   Report Status PENDING  Incomplete  Gram stain     Status: None   Collection Time: 12/06/16  1:04 PM  Result Value Ref Range  Status   Specimen Description FLUID RIGHT ANKLE  Final   Special Requests NONE  Final   Gram Stain   Final    FEW WBC PRESENT, PREDOMINANTLY PMN NO ORGANISMS SEEN    Report Status 12/06/2016 FINAL  Final  Culture, Urine     Status: None   Collection Time: 12/07/16  5:06 AM  Result Value Ref Range Status   Specimen Description URINE, RANDOM  Final   Special Requests NONE  Final   Culture NO GROWTH  Final   Report Status 12/08/2016 FINAL  Final  Aerobic/Anaerobic Culture (surgical/deep wound)     Status: None (Preliminary result)   Collection Time: 12/07/16 11:33 AM  Result Value Ref Range Status   Specimen Description ABSCESS RIGHT ANKLE  Final   Special Requests NONE  Final   Gram Stain   Final    MODERATE WBC PRESENT, PREDOMINANTLY PMN NO ORGANISMS SEEN    Culture NO GROWTH < 24 HOURS  Final   Report Status PENDING  Incomplete    Staci Righter, MD [facility] [email or web address] [phone] pager  [phone] cell 12/08/2016, 2:08 PM

## 2016-12-08 NOTE — Progress Notes (Signed)
[facility] (OTS)  1 Day Post-Op Procedure(s) (LRB): IRRIGATION AND DEBRIDEMENT ANKLE (Right)  Subjective: Patient reports pain as mild and improved from yesterday. Eager to shower.    Objective: Current Vitals Blood pressure 99/88, pulse 78, temperature 98 F (36.7 C), temperature source Oral, resp. rate 20, height 5\' 10"  (1.778 m), weight 64.4 kg (142 lb), SpO2 100 %. Vital signs in last 24 hours: Temp:  [97.9 F (36.6 C)-98.4 F (36.9 C)] 98 F (36.7 C) (05/20 0458) Pulse Rate:  [75-87] 78 (05/20 0458) Resp:  [16-20] 20 (05/20 0458) BP: (99-129)/(62-88) 99/88 (05/20 0458) SpO2:  [99 %-100 %] 100 % (05/20 0458)  Intake/Output from previous day: 05/19 0701 - 05/20 0700 In: 1087.8 [P.O.:480; I.V.:557.8; IV Piggyback:50] Out: 1435 [Urine:1400; Drains:15; Blood:20]  LABS  Recent Labs  12/05/16 2353 12/07/16 0357  HGB 11.8* 12.1*    Recent Labs  12/05/16 2353 12/07/16 0357  WBC 7.7 7.6  RBC 3.95* 4.14*  HCT 35.3* 36.7*  PLT 248 302    Recent Labs  12/05/16 2353 12/07/16 0357  NA 137 137  K 4.3 4.0  CL 104 103  CO2 26 26  BUN <5* 12  CREATININE 0.59* 0.67  GLUCOSE 94 92  CALCIUM 9.1 9.3   No results for input(s): LABPT, INR in the last 72 hours.   Physical Exam RLE Dressing changed; wound intact, clean, dry  Edema/ swelling controlled  Sens: DPN, SPN, TN intact  Motor: EHL, FHL, and lessor toe ext and flex all intact grossly  Brisk cap refill, warm to touch  Drainage in hemovac  Assessment/Plan: 1 Day Post-Op Procedure(s) (LRB): IRRIGATION AND DEBRIDEMENT ANKLE (Right) 1. PT/OT PWB right lower in CAM boot 2. DVT proph [place] hep 3. Await sensitivities and hoping for PO meds! 4. ID to see today, Dr. Luciana Axeomer 5. We will d/c drain tomorrow  6. Patient will F/u 8-14 days with us post d/c  Myrene GalasMichael Kriston Pasquarello, MD [facility] [phone] (p)

## 2016-12-08 NOTE — Progress Notes (Signed)
[facility] PROGRESS NOTE  David [name] NWG:[id number] DOB: [birth year] DOA: 12/05/2016 PCP: Patient, No Pcp Per  Interim summary and HPI 36 year old male with medical history significant for IV drug use including hearing and amphetamine. Who presented to the emergency department complaining of right ankle pain and swelling. Symptoms has been present for approximately 1.5 week and worsening. Patient denies any trauma or injury to his joint.  Assessment/Plan: 1-synovitis/right ankle septic arthritis: Due to gram-negative rods microorganism -Initially received vancomycin 2 doses on admission.  - after synovial fluid studies available, abx's narrow to rocephin. Serratia M, growing.  -patient is status post arthrotomy and partial synovectomy; will continue following orthopedic services rec's regarding post-operative care  -patient is no septic -anticipate treatment with oral antibiotics; will follow ID rec's regarding which abx and length of therapy.  2-heroine abuse/with mild withdrawal -Cessation counseling provided  -Will continue using PRN morphine/Vicodin, also PRN clonidine, Phenergan and Ativan in order to assist with withdrawal symptoms.   3-hx of Hep C -HCV RNA quantitative tests pending -ID has been immobile and has recommended outpatient follow-up for hepatitis C treatment.  4-tobacco abuse -Cessation counseling has been provided -Will use nicotine patch.  Code Status: Full code Family Communication: No family at bedside Disposition Plan: Hopefully home in the next 24-48 hours.continue IV antibiotics for now. Follow orthopedic services for postoperative recommendations.   Consultants:  Dr. Carola Frost (orthopedic service).  ID  Procedures: OPEN ARTHROTOMY WITH PARTIAL SYNOVECTOMY AND DEBRIDEMENT ANKLE (Right) 12/07/16  Antibiotics:  Vancomycin 5/18>>>5/19  Rocephin 5/19  HPI/Subjective: Feeling much better control. Afebrile, denies chest pain, denies shortness  of breath. Currently without any nausea or vomiting. He reports that his right ankle pain is just mild and is inquiring about shower.  Objective: Vitals:   12/08/16 0458 12/08/16 1539  BP: 99/88 120/68  Pulse: 78 75  Resp: 20 18  Temp: 98 F (36.7 C) 98.5 F (36.9 C)    Intake/Output Summary (Last 24 hours) at 12/08/16 1611 Last data filed at 12/08/16 1015  Gross per 24 hour  Intake           587.83 ml  Output             1425 ml  Net          -837.17 ml   Filed Weights   12/06/16 0611  Weight: 64.4 kg (142 lb)    Exam:   General:  Afebrile, denies chest pain, no shortness of breath. Overall feeling better and eager to go home. Patient reports that his right ankle pain is just mild and will like to have a shower.   Cardiovascular: RRR, no JVD, no murmurs, no gallops.  Respiratory: Clear to auscultation bilaterally, good air movement and not using accessory muscles.  Abdomen: Soft, nontender, nondistended, positive bowel sounds   Musculoskeletal: Patient with pain and mild restricted range of motion of his right ankle, otherwise no erythema, no swelling, no cyanosis and no other affected joints.   Data Reviewed: Basic Metabolic Panel:  Recent Labs Lab 12/05/16 2353 12/07/16 0357  NA 137 137  K 4.3 4.0  CL 104 103  CO2 26 26  GLUCOSE 94 92  BUN <5* 12  CREATININE 0.59* 0.67  CALCIUM 9.1 9.3   CBC:  Recent Labs Lab 12/05/16 0353 12/05/16 2353 12/07/16 0357  WBC 6.8 7.7 7.6  NEUTROABS 3.4 4.8  --   HGB 11.1* 11.8* 12.1*  HCT 33.6* 35.3* 36.7*  MCV 92.3 89.4 88.6  PLT 252 248  302   CBG: No results for input(s): GLUCAP in the last 168 hours.  Recent Results (from the past 240 hour(s))  Culture, body fluid-bottle     Status: Abnormal (Preliminary result)   Collection Time: 12/06/16  1:04 PM  Result Value Ref Range Status   Specimen Description FLUID RIGHT ANKLE  Final   Special Requests NONE  Final   Gram Stain   Final    GRAM NEGATIVE RODS IN  BOTH AEROBIC AND ANAEROBIC BOTTLES CRITICAL RESULT CALLED TO, READ BACK BY AND VERIFIED WITH: T OOSIM,RN AT 0709 12/07/16 BY L BENFIELD    Culture SERRATIA MARCESCENS SUSCEPTIBILITIES TO FOLLOW  (A)  Final   Report Status PENDING  Incomplete  Gram stain     Status: None   Collection Time: 12/06/16  1:04 PM  Result Value Ref Range Status   Specimen Description FLUID RIGHT ANKLE  Final   Special Requests NONE  Final   Gram Stain   Final    FEW WBC PRESENT, PREDOMINANTLY PMN NO ORGANISMS SEEN    Report Status 12/06/2016 FINAL  Final  Culture, Urine     Status: None   Collection Time: 12/07/16  5:06 AM  Result Value Ref Range Status   Specimen Description URINE, RANDOM  Final   Special Requests NONE  Final   Culture NO GROWTH  Final   Report Status 12/08/2016 FINAL  Final  Aerobic/Anaerobic Culture (surgical/deep wound)     Status: None (Preliminary result)   Collection Time: 12/07/16 11:33 AM  Result Value Ref Range Status   Specimen Description ABSCESS RIGHT ANKLE  Final   Special Requests NONE  Final   Gram Stain   Final    MODERATE WBC PRESENT, PREDOMINANTLY PMN NO ORGANISMS SEEN    Culture NO GROWTH < 24 HOURS  Final   Report Status PENDING  Incomplete     Studies: No results found.  Scheduled Meds: . heparin  5,000 Units Subcutaneous Q8H  . ketorolac  10 mg Oral Q6H  . lidocaine  5 mL Intradermal Once  . nicotine  21 mg Transdermal Daily   Continuous Infusions: . cefTRIAXone (ROCEPHIN)  IV 2 g (12/08/16 1521)  . lactated ringers 10 mL/hr at 12/07/16 1003    Principal Problem:   Synovitis of right ankle Active Problems:   Heroin abuse   Polysubstance abuse    Time spent: 25 minutes    David [name], David [name]  [facility] Pager [phone]. If 7PM-7AM, please contact night-coverage at [email or web address], password [email or web address] 12/08/2016, 4:11 PM  LOS: 2 days

## 2016-12-09 ENCOUNTER — Encounter (HOSPITAL_COMMUNITY): Payer: Self-pay | Admitting: Orthopedic Surgery

## 2016-12-09 DIAGNOSIS — M25571 Pain in right ankle and joints of right foot: Secondary | ICD-10-CM

## 2016-12-09 DIAGNOSIS — Z720 Tobacco use: Secondary | ICD-10-CM

## 2016-12-09 DIAGNOSIS — M25471 Effusion, right ankle: Secondary | ICD-10-CM

## 2016-12-09 DIAGNOSIS — M009 Pyogenic arthritis, unspecified: Secondary | ICD-10-CM | POA: Diagnosis present

## 2016-12-09 LAB — CULTURE, BODY FLUID-BOTTLE

## 2016-12-09 LAB — CULTURE, BODY FLUID W GRAM STAIN -BOTTLE

## 2016-12-09 LAB — HCV RNA QUANT
HCV QUANT: 40 [IU]/mL — AB (ref >50)
HCV Quantitative Log: 1.602 log10 IU/mL — ABNORMAL LOW (ref >1.70)

## 2016-12-09 LAB — ECHO INTRAOPERATIVE TEE
Height: 70 in
WEIGHTICAEL: 2272 [oz_av]

## 2016-12-09 LAB — GC/CHLAMYDIA PROBE AMP (~~LOC~~) NOT AT ARMC
CHLAMYDIA, DNA PROBE: NEGATIVE
NEISSERIA GONORRHEA: NEGATIVE

## 2016-12-09 MED ORDER — CIPROFLOXACIN HCL 500 MG PO TABS: 750.0000 mg | ORAL_TABLET | Freq: Two times a day (BID) | ORAL | 0 refills | Status: AC

## 2016-12-09 MED ORDER — NICOTINE 21 MG/24HR TD PT24: 21.0000 mg | MEDICATED_PATCH | Freq: Every day | TRANSDERMAL | 0 refills | Status: DC

## 2016-12-09 MED ORDER — ACETAMINOPHEN 325 MG PO TABS: 650.0000 mg | ORAL_TABLET | Freq: Four times a day (QID) | ORAL | 0 refills | Status: DC | PRN

## 2016-12-09 MED ORDER — DICLOFENAC SODIUM 75 MG PO TBEC: 75.0000 mg | DELAYED_RELEASE_TABLET | Freq: Three times a day (TID) | ORAL | 0 refills | Status: DC | PRN

## 2016-12-09 NOTE — Progress Notes (Signed)
Pt in a very anxious and hurried manner to leave and be DC. Pt was given discharge instructions, prescriptions, and care notes. Pt verbalized understanding AEB no further questions or concerns at this time. DC instructions regarding wound care and FU appt was reviewed, Pt stated he knew what to do and was just ready to leave. IV was discontinued, no redness, pain, or swelling noted at this time. Pt left the floor via wheelchair with staff in stable condition.

## 2016-12-09 NOTE — Discharge Summary (Signed)
Physician Discharge Summary  David [name] ZOX:[id number] DOB: 23-Dec-1980 DOA: 12/05/2016  PCP: Patient, No Pcp Per  Admit date: 12/05/2016 Discharge date: 12/09/2016  Time spent: 35 minutes  Recommendations for Outpatient Follow-up:  1. Repeat BMET to follow electrolytes and renal function 2. Please follow and assist patient with heroine cessation 3. Eve able to keep himself drug free will require outpatient follow-up with infectious disease for the treatment of hepatitis C and hepatitis B.   Discharge Diagnoses:  Principal Problem:   Synovitis of right ankle Active Problems:   Heroin abuse   Polysubstance abuse   Pain and swelling of right ankle   Tobacco abuse   Pyogenic arthritis of ankle (HCC)   Discharge Condition: Stable and improved. Patient discharged home with instructions to follow-up with orthopedic therapy in the next 10 days and also encourage to establish care at the wellness Center for further follow-up from a primary care standpoint.  Diet recommendation: Regular diet  Filed Weights   12/06/16 0611  Weight: 64.4 kg (142 lb)    Brief History of present illness:  36 year old male with medical history significant for IV drug use including hearing and amphetamine. Who presented to the emergency department complaining of right ankle pain and swelling. Symptoms has been present for approximately 1.5 week and worsening. Patient denies any trauma or injury to his joint.  Hospital Course:  1-synovitis/right ankle septic arthritis: Due to gram-negative rods microorganism -Initially received vancomycin 2 doses on admission.  - after synovial fluid studies available, abx's narrow to rocephin. Serratia Marcescens resistant to cefazolin and sensitive to fluoroquinolones.  -patient is status post arthrotomy and partial synovectomy; will follow up with orthopedic service at discharge (10-14 days) -patient is no septic at discharge -will treatment with oral ciprofloxacin  for 4 weeks  2-heroine abuse/with mild withdrawal -Cessation counseling provided  -while inpatient received PRN morphine/Vicodin, also PRN clonidine, Phenergan and Ativan in order to assist with withdrawal symptoms.  -no further active withdrawal symptoms appreciated   3-hx of Hep C and Hep B -HCV RNA quantitative tests pending -ID has been involved and has recommended outpatient follow-up for hepatitis C and Hep B treatment.  4-tobacco abuse -Cessation counseling has been provided -Encourage to use nicotine patch as needed to help with the quitting process.  Procedures: OPEN ARTHROTOMY WITH PARTIAL SYNOVECTOMYAND DEBRIDEMENT ANKLE (Right) 12/07/16  Consultations:  Dr. Carola Frost (orthopedic service).  ID  Discharge Exam: Vitals:   12/08/16 2240 12/09/16 0530  BP: 110/66 116/79  Pulse: 79 66  Resp: 18 18  Temp: 98.1 F (36.7 C) 98.5 F (36.9 C)    General:  Afebrile, denies chest pain, no shortness of breath. Overall feeling better and eager to go home. Patient reports that his right ankle pain is just mild and will like to go home.  Cardiovascular: RRR, no JVD, no murmurs, no gallops.  Respiratory: Clear to auscultation bilaterally, good air movement and not using accessory muscles.  Abdomen: Soft, nontender, nondistended, positive bowel sounds   Musculoskeletal: Patient with pain and very mild restricted range of motion of his right ankle, otherwise no erythema, no swelling, no cyanosis and no other affected joints.    Discharge Instructions   Discharge Instructions    Discharge instructions    Complete by:  As directed    Stop IV recreational drugs Stop smoking Take medications as prescribed Keep yourself well hydrated  Follow up with orthopedic service as instructed and follow their recommendations regarding wound care and weight bearing  Current Discharge Medication List    START taking these medications   Details  acetaminophen (TYLENOL) 325 MG  tablet Take 2 tablets (650 mg total) by mouth every 6 (six) hours as needed for mild pain or fever. Qty: 40 tablet, Refills: 0    ciprofloxacin (CIPRO) 500 MG tablet Take 1.5 tablets (750 mg total) by mouth 2 (two) times daily. Qty: 42 tablet, Refills: 0    diclofenac (VOLTAREN) 75 MG EC tablet Take 1 tablet (75 mg total) by mouth every 8 (eight) hours as needed (severe pain). Qty: 20 tablet, Refills: 0    nicotine (NICODERM CQ - DOSED IN MG/24 HOURS) 21 mg/24hr patch Place 1 patch (21 mg total) onto the skin daily. Qty: 28 patch, Refills: 0       No Known Allergies Follow-up Information    [name], [name], MD. Schedule an appointment as soon as possible for a visit in 10 day(s).   Specialty:  Orthopedic Surgery Contact information: [address] [place] [address] [phone]            The results of significant diagnostics from this hospitalization (including imaging, microbiology, ancillary and laboratory) are listed below for reference.    Significant Diagnostic Studies: Dg Ankle Complete Right  Result Date: 12/05/2016 CLINICAL DATA:  Right ankle pain for 2 weeks. Swelling. No known injury. EXAM: RIGHT ANKLE - COMPLETE 3+ VIEW COMPARISON:  None. FINDINGS: There is no evidence of fracture or dislocation. There is no evidence of arthropathy or other focal bone abnormality. There is a small tibiotalar joint effusion. Mild lateral soft tissue edema. IMPRESSION: Soft tissue edema and small joint effusion.  No osseous abnormality. Electronically Signed   By: Rubye OaksMelanie  Ehinger M.D.   On: 12/05/2016 03:16   David [name] Contrast  Result Date: 12/06/2016 CLINICAL DATA:  Hepatitis-C and heroin abuser complaining of worsening right ankle swelling and pain onset 2 weeks ago. EXAM: MRI OF THE RIGHT ANKLE WITHOUT AND WITH CONTRAST TECHNIQUE: Multiplanar, multisequence David imaging of the ankle was performed before and after the administration of intravenous  contrast. CONTRAST:  15 cc MultiHance IV COMPARISON:  Radiographs from the same day FINDINGS: TENDONS Peroneal: Intact peroneus longus and peroneus brevis tendons. Posteromedial: Intact tibialis posterior, flexor hallucis longus and flexor digitorum longus tendons. Anterior: Intact tibialis anterior, extensor hallucis longus and extensor digitorum longus tendons. Achilles: Intact. Plantar Fascia: Intact. LIGAMENTS Lateral: Intact. Medial: Intact. CARTILAGE Ankle Joint: Small to moderate enhancing tibiotalar and small posterior subtalar joint effusion may be secondary to a synovitis. A septic arthritis is not entirely excluded though preservation of the cartilage within the ankle and subtalar joints would make this less likely. Subtalar Joints/Sinus Tarsi: Intact with small posterior subtalar joint effusion. Bones: Minimal reactive edema about the tibiotalar and subtalar joints. Other: Soft tissue edema about the malleoli and hindfoot. IMPRESSION: IMPRESSION Soft tissue edema consistent with cellulitis. Enhancing ankle and subtalar joint effusions are likely related to a synovitis and less likely septic arthritis given the preservation of the cartilage within the joint spaces. No drainable fluid collections to suggest abscess. No evidence of a pyomyositis. Electronically Signed   By: Tollie Ethavid  Kwon M.D.   On: 12/06/2016 03:30    Microbiology: Recent Results (from the past 240 hour(s))  Culture, body fluid-bottle     Status: Abnormal   Collection Time: 12/06/16  1:04 PM  Result Value Ref Range Status   Specimen Description FLUID RIGHT ANKLE  Final   Special Requests  NONE  Final   Gram Stain   Final    GRAM NEGATIVE RODS IN BOTH AEROBIC AND ANAEROBIC BOTTLES CRITICAL RESULT CALLED TO, READ BACK BY AND VERIFIED WITH: T OOSIM,RN AT 0709 12/07/16 BY L BENFIELD    Culture SERRATIA MARCESCENS (A)  Final   Report Status 12/09/2016 FINAL  Final   Organism ID, Bacteria SERRATIA MARCESCENS  Final       Susceptibility   Serratia marcescens - MIC*    CEFAZOLIN >=64 RESISTANT Resistant     CEFEPIME <=1 SENSITIVE Sensitive     CEFTAZIDIME <=1 SENSITIVE Sensitive     CEFTRIAXONE <=1 SENSITIVE Sensitive     CIPROFLOXACIN <=0.25 SENSITIVE Sensitive     GENTAMICIN <=1 SENSITIVE Sensitive     TRIMETH/SULFA <=20 SENSITIVE Sensitive     * SERRATIA MARCESCENS  Gram stain     Status: None   Collection Time: 12/06/16  1:04 PM  Result Value Ref Range Status   Specimen Description FLUID RIGHT ANKLE  Final   Special Requests NONE  Final   Gram Stain   Final    FEW WBC PRESENT, PREDOMINANTLY PMN NO ORGANISMS SEEN    Report Status 12/06/2016 FINAL  Final  Culture, Urine     Status: None   Collection Time: 12/07/16  5:06 AM  Result Value Ref Range Status   Specimen Description URINE, RANDOM  Final   Special Requests NONE  Final   Culture NO GROWTH  Final   Report Status 12/08/2016 FINAL  Final  Aerobic/Anaerobic Culture (surgical/deep wound)     Status: None (Preliminary result)   Collection Time: 12/07/16 11:33 AM  Result Value Ref Range Status   Specimen Description ABSCESS RIGHT ANKLE  Final   Special Requests NONE  Final   Gram Stain   Final    MODERATE WBC PRESENT, PREDOMINANTLY PMN NO ORGANISMS SEEN    Culture   Final    NO GROWTH 2 DAYS NO ANAEROBES ISOLATED; CULTURE IN PROGRESS FOR 5 DAYS   Report Status PENDING  Incomplete     Labs: Basic Metabolic Panel:  Recent Labs Lab 12/05/16 2353 12/07/16 0357  NA 137 137  K 4.3 4.0  CL 104 103  CO2 26 26  GLUCOSE 94 92  BUN <5* 12  CREATININE 0.59* 0.67  CALCIUM 9.1 9.3   CBC:  Recent Labs Lab 12/05/16 0353 12/05/16 2353 12/07/16 0357  WBC 6.8 7.7 7.6  NEUTROABS 3.4 4.8  --   HGB 11.1* 11.8* 12.1*  HCT 33.6* 35.3* 36.7*  MCV 92.3 89.4 88.6  PLT 252 248 302    Signed:  Vassie Loll MD.  [facility] 12/09/2016, 1:36 PM

## 2016-12-09 NOTE — Progress Notes (Signed)
Contacted [facility] for crutches for home. Isidoro DonningAlesia Shoshannah Faubert RN CCM Case Mgmt phone [phone]

## 2016-12-09 NOTE — Progress Notes (Signed)
Orthopedic Tech Progress Note Patient Details:  David SchlatterRichard [name] 1981-06-08 [id number]  Ortho Devices Type of Ortho Device: CAM walker Ortho Device/Splint Location: rle Ortho Device/Splint Interventions: Application   [name] 12/09/2016, 10:19 AM

## 2016-12-09 NOTE — Discharge Instructions (Signed)
Orthopaedic Discharge instructions  weightbear as tolerated on R leg with CAM boot and crutches Ok to change dressing in 2-3 days (around 12/11/2016) Can clean wounds with soap and water only   Follow up with ortho in 10 days, please call for appointment

## 2016-12-09 NOTE — Progress Notes (Signed)
Orthopedic Trauma Service Progress Note    Subjective:     No new issues Wants to go home  R ankle is sore  ROS As above Objective:   VITALS:   Vitals:   12/08/16 0458 12/08/16 1539 12/08/16 2240 12/09/16 0530  BP: 99/88 120/68 110/66 116/79  Pulse: 78 75 79 66  Resp: 20 18 18 18   Temp: 98 F (36.7 C) 98.5 F (36.9 C) 98.1 F (36.7 C) 98.5 F (36.9 C)  TempSrc: Oral  Oral Oral  SpO2: 100% 100% 100% 100%  Weight:      Height:        Intake/Output      05/20 0701 - 05/21 0700 05/21 0701 - 05/22 0700   P.O. 240 360   I.V. (mL/kg)     IV Piggyback 50    Total Intake(mL/kg) 290 (4.5) 360 (5.6)   Urine (mL/kg/hr) 350 (0.2) 600 (1.2)   Drains 20 (0)    Stool 0 (0)    Blood     Total Output 370 600   Net -80 -240        Stool Occurrence 1 x      LABS  No results found for this or any previous visit (from the past 24 hour(s)).   PHYSICAL EXAM:   Gen: in bed, sleeping, NAD: Ext:       Right Lower Extremity   Dressing removed  Incisions look good  Effusion minimal  Drain d/c'd by myself w/o issue  Motor and sensory functions intact  + DP pulse   Assessment/Plan: 2 Days Post-Op   Principal Problem:   Pyogenic arthritis of ankle (HCC) Active Problems:   Heroin abuse   Polysubstance abuse   Synovitis of right ankle   Pain and swelling of right ankle   Tobacco abuse   Anti-infectives    Start     Dose/Rate Route Frequency Ordered Stop   12/09/16 0000  ciprofloxacin (CIPRO) 500 MG tablet     750 mg Oral 2 times daily 12/09/16 1330 01/06/17 2359   12/07/16 1530  cefTRIAXone (ROCEPHIN) 2 g in dextrose 5 % 50 mL IVPB     2 g 100 mL/hr over 30 Minutes Intravenous Every 24 hours 12/07/16 1517     12/07/16 1115  levofloxacin (LEVAQUIN) IVPB 750 mg     750 mg 100 mL/hr over 90 Minutes Intravenous  Once 12/07/16 1104 12/07/16 1113   12/07/16 1042  vancomycin (VANCOCIN) powder  Status:  Discontinued       As needed 12/07/16 1206  12/07/16 1208   12/07/16 1042  tobramycin (NEBCIN) injection  Status:  Discontinued       As needed 12/07/16 1208 12/07/16 1208   12/06/16 1400  vancomycin (VANCOCIN) IVPB 1000 mg/200 mL premix  Status:  Discontinued     1,000 mg 200 mL/hr over 60 Minutes Intravenous Every 8 hours 12/06/16 0400 12/07/16 1517   12/06/16 0345  vancomycin (VANCOCIN) IVPB 1000 mg/200 mL premix     1,000 mg 200 mL/hr over 60 Minutes Intravenous  Once 12/06/16 0335 12/06/16 0454    .  POD/HD#: 2  R ankle septic arthritis s/p I&D             WBAT in CAM  Can change dressing in 2-3 days  Cover with dry dressing if drainage o/w can leave open  Ok to clean wounds with soap and water  Crutches as needed    - Pain management:  Per primary              Would also include NSAIDs                          Ketorolac 10 mg po q6h x 3 days    - Medical issues              Polysubstance abuse                         Active IV drug user                                      Heroin                                     Meth    - ID:            ID recs cipro 750mg  BID x 4 weeks   - Activity:             WBAT in cam    - FEN/GI prophylaxis/Foley/Lines:             Reg diet    - Dispo:             ok to dc from ortho standpoint  Follow up in 10-14 days with ortho for suture removal       Mearl Latin, PA-C [facility] [phone] (P) [phone] (O) 12/09/2016, 2:37 PM

## 2016-12-09 NOTE — Progress Notes (Signed)
  ID progress note:  36yo M with serratia septic arthritis, currently on ceftriaxone.  - recommend to treat with cipro 750mg  BID x 4 wk  Chronic hep C and hep B without hepatic coma: - refer patient to community wellness for follow up, if drug free, can get access for treatment  Marty Sadlowski B. Drue SecondSnider MD MPH [facility] [phone]

## 2016-12-09 NOTE — Progress Notes (Signed)
Orthopedic Tech Progress Note Patient Details:  David SchlatterRichard [name] May 07, 1981 [id number]  Ortho Devices Type of Ortho Device: Crutches Ortho Device/Splint Location: rle Ortho Device/Splint Interventions: Ordered, Adjustment   Jennye MoccasinHughes, [name] 12/09/2016, 3:20 PM

## 2016-12-09 NOTE — Care Management Note (Signed)
Case Management Note  Patient Details  Name: Garth SchlatterRichard Riddell MRN: [id number] Date of Birth: [birth year]  Subjective/Objective:   Synovitis of right ankle, heroin abuse, polysubstance abuse                 Action/Plan:  Discharge Planning: NCM spoke to pt and provided pt with contact number for [facility] [place] to go by office and pick indigent paperwork and set up appt. Also provided pt with [facility] and [facility] in [place] if he decides to [place].  [facility]    Expected Discharge Date:  12/09/16               Expected Discharge Plan:  Home/Self Care  In-House Referral:  Clinical Social Work (substance abuser)  Discharge planning Services  CM Consult, [facility], Follow-up appt scheduled  Post Acute Care Choice:  NA Choice offered to:  NA  DME Arranged:  Crutches DME Agency:  Other - Comment  HH Arranged:  NA HH Agency:  NA  Status of Service:  Completed, signed off  If discussed at Long Length of Stay Meetings, dates discussed:    Additional Comments:  Elliot CousinShavis, Standley Bargo Ellen, RN 12/09/2016, 3:18 PM

## 2016-12-10 LAB — HEPATITIS B SURFACE ANTIBODY,QUALITATIVE: Hep B S Ab: REACTIVE

## 2016-12-12 LAB — AEROBIC/ANAEROBIC CULTURE W GRAM STAIN (SURGICAL/DEEP WOUND)

## 2016-12-12 LAB — AEROBIC/ANAEROBIC CULTURE (SURGICAL/DEEP WOUND)

## 2016-12-23 NOTE — Addendum Note (Signed)
Addendum  created 12/23/16 1538 by Royetta Probus, MD   Sign clinical note    

## 2017-06-05 ENCOUNTER — Encounter (HOSPITAL_BASED_OUTPATIENT_CLINIC_OR_DEPARTMENT_OTHER): Payer: Self-pay | Admitting: Emergency Medicine

## 2017-06-05 ENCOUNTER — Other Ambulatory Visit: Payer: Self-pay

## 2017-06-05 ENCOUNTER — Emergency Department (HOSPITAL_BASED_OUTPATIENT_CLINIC_OR_DEPARTMENT_OTHER): Payer: Self-pay

## 2017-06-05 ENCOUNTER — Inpatient Hospital Stay (HOSPITAL_BASED_OUTPATIENT_CLINIC_OR_DEPARTMENT_OTHER)
Admission: EM | Admit: 2017-06-05 | Discharge: 2017-06-08 | DRG: 871 | Discharge status: Against Medical Advice | Payer: Self-pay | Attending: Internal Medicine | Admitting: Internal Medicine

## 2017-06-05 DIAGNOSIS — D6959 Other secondary thrombocytopenia: Secondary | ICD-10-CM | POA: Diagnosis present

## 2017-06-05 DIAGNOSIS — F1310 Sedative, hypnotic or anxiolytic abuse, uncomplicated: Secondary | ICD-10-CM | POA: Diagnosis present

## 2017-06-05 DIAGNOSIS — R6521 Severe sepsis with septic shock: Secondary | ICD-10-CM | POA: Diagnosis present

## 2017-06-05 DIAGNOSIS — L03114 Cellulitis of left upper limb: Secondary | ICD-10-CM | POA: Diagnosis present

## 2017-06-05 DIAGNOSIS — F329 Major depressive disorder, single episode, unspecified: Secondary | ICD-10-CM | POA: Diagnosis present

## 2017-06-05 DIAGNOSIS — E876 Hypokalemia: Secondary | ICD-10-CM | POA: Diagnosis present

## 2017-06-05 DIAGNOSIS — F1510 Other stimulant abuse, uncomplicated: Secondary | ICD-10-CM | POA: Diagnosis present

## 2017-06-05 DIAGNOSIS — A4101 Sepsis due to Methicillin susceptible Staphylococcus aureus: Principal | ICD-10-CM | POA: Diagnosis present

## 2017-06-05 DIAGNOSIS — Z87442 Personal history of urinary calculi: Secondary | ICD-10-CM

## 2017-06-05 DIAGNOSIS — B181 Chronic viral hepatitis B without delta-agent: Secondary | ICD-10-CM | POA: Diagnosis present

## 2017-06-05 DIAGNOSIS — R45851 Suicidal ideations: Secondary | ICD-10-CM | POA: Diagnosis present

## 2017-06-05 DIAGNOSIS — Z833 Family history of diabetes mellitus: Secondary | ICD-10-CM

## 2017-06-05 DIAGNOSIS — F17210 Nicotine dependence, cigarettes, uncomplicated: Secondary | ICD-10-CM | POA: Diagnosis present

## 2017-06-05 DIAGNOSIS — F1910 Other psychoactive substance abuse, uncomplicated: Secondary | ICD-10-CM | POA: Diagnosis present

## 2017-06-05 DIAGNOSIS — F1123 Opioid dependence with withdrawal: Secondary | ICD-10-CM | POA: Diagnosis present

## 2017-06-05 DIAGNOSIS — A419 Sepsis, unspecified organism: Secondary | ICD-10-CM

## 2017-06-05 DIAGNOSIS — B182 Chronic viral hepatitis C: Secondary | ICD-10-CM

## 2017-06-05 LAB — URINALYSIS, ROUTINE W REFLEX MICROSCOPIC
Bilirubin Urine: NEGATIVE
Glucose, UA: NEGATIVE mg/dL
HGB URINE DIPSTICK: NEGATIVE
KETONES UR: NEGATIVE mg/dL
LEUKOCYTES UA: NEGATIVE
Nitrite: NEGATIVE
Protein, ur: NEGATIVE mg/dL
Specific Gravity, Urine: <1.005 — ABNORMAL LOW (ref 1.005–1.030)
pH: 6 (ref 5.0–8.0)

## 2017-06-05 LAB — CBC WITH DIFFERENTIAL/PLATELET
BASOS ABS: 0 10*3/uL (ref 0.0–0.1)
Basophils Relative: 0 %
EOS PCT: 0 %
Eosinophils Absolute: 0 10*3/uL (ref 0.0–0.7)
HCT: 38.8 % — ABNORMAL LOW (ref 39.0–52.0)
Hemoglobin: 13.2 g/dL (ref 13.0–17.0)
Lymphocytes Relative: 3 %
Lymphs Abs: 0.2 10*3/uL — ABNORMAL LOW (ref 0.7–4.0)
MCH: 29.9 pg (ref 26.0–34.0)
MCHC: 34 g/dL (ref 30.0–36.0)
MCV: 88 fL (ref 78.0–100.0)
MONO ABS: 0 10*3/uL — AB (ref 0.1–1.0)
Monocytes Relative: 1 %
Neutro Abs: 7.9 10*3/uL — ABNORMAL HIGH (ref 1.7–7.7)
Neutrophils Relative %: 96 %
PLATELETS: 146 10*3/uL — AB (ref 150–400)
RBC: 4.41 MIL/uL (ref 4.22–5.81)
RDW: 13 % (ref 11.5–15.5)
WBC: 8.2 10*3/uL (ref 4.0–10.5)

## 2017-06-05 LAB — CBC
HEMATOCRIT: 33.3 % — AB (ref 39.0–52.0)
Hemoglobin: 11.3 g/dL — ABNORMAL LOW (ref 13.0–17.0)
MCH: 29.7 pg (ref 26.0–34.0)
MCHC: 33.9 g/dL (ref 30.0–36.0)
MCV: 87.4 fL (ref 78.0–100.0)
Platelets: 134 10*3/uL — ABNORMAL LOW (ref 150–400)
RBC: 3.81 MIL/uL — ABNORMAL LOW (ref 4.22–5.81)
RDW: 13.1 % (ref 11.5–15.5)
WBC: 23.9 10*3/uL — ABNORMAL HIGH (ref 4.0–10.5)

## 2017-06-05 LAB — LACTIC ACID, PLASMA: LACTIC ACID, VENOUS: 3.2 mmol/L — AB (ref 0.5–1.9)

## 2017-06-05 LAB — COMPREHENSIVE METABOLIC PANEL
ALT: 70 U/L — AB (ref 17–63)
AST: 86 U/L — AB (ref 15–41)
Albumin: 3.6 g/dL (ref 3.5–5.0)
Alkaline Phosphatase: 184 U/L — ABNORMAL HIGH (ref 38–126)
Anion gap: 8 (ref 5–15)
BILIRUBIN TOTAL: 1.5 mg/dL — AB (ref 0.3–1.2)
BUN: 10 mg/dL (ref 6–20)
CO2: 27 mmol/L (ref 22–32)
CREATININE: 0.74 mg/dL (ref 0.61–1.24)
Calcium: 9.4 mg/dL (ref 8.9–10.3)
Chloride: 100 mmol/L — ABNORMAL LOW (ref 101–111)
GFR calc Af Amer: >60 mL/min (ref >60)
Glucose, Bld: 107 mg/dL — ABNORMAL HIGH (ref 65–99)
Potassium: 3.2 mmol/L — ABNORMAL LOW (ref 3.5–5.1)
SODIUM: 135 mmol/L (ref 135–145)
TOTAL PROTEIN: 7 g/dL (ref 6.5–8.1)

## 2017-06-05 LAB — CREATININE, SERUM
Creatinine, Ser: 0.99 mg/dL (ref 0.61–1.24)
GFR calc Af Amer: >60 mL/min (ref >60)
GFR calc non Af Amer: >60 mL/min (ref >60)

## 2017-06-05 LAB — MRSA PCR SCREENING: MRSA BY PCR: NEGATIVE

## 2017-06-05 LAB — I-STAT CG4 LACTIC ACID, ED
LACTIC ACID, VENOUS: 4.26 mmol/L — AB (ref 0.5–1.9)
Lactic Acid, Venous: 5.91 mmol/L (ref 0.5–1.9)

## 2017-06-05 LAB — PROTIME-INR
INR: 1.43
Prothrombin Time: 17.3 seconds — ABNORMAL HIGH (ref 11.4–15.2)

## 2017-06-05 LAB — APTT: aPTT: 38 seconds — ABNORMAL HIGH (ref 24–36)

## 2017-06-05 LAB — PROCALCITONIN: PROCALCITONIN: 110.54 ng/mL

## 2017-06-05 MED ORDER — VANCOMYCIN HCL IN DEXTROSE 1-5 GM/200ML-% IV SOLN
1000.0000 mg | Freq: Once | INTRAVENOUS | Status: DC
  Filled 2017-06-05: qty 200

## 2017-06-05 MED ORDER — SODIUM CHLORIDE 0.9 % IV SOLN
1500.0000 mg | Freq: Once | INTRAVENOUS | Status: AC
  Administered 2017-06-05: 1000 mg via INTRAVENOUS
  Filled 2017-06-05: qty 1500

## 2017-06-05 MED ORDER — VANCOMYCIN HCL 10 G IV SOLR
1250.0000 mg | Freq: Two times a day (BID) | INTRAVENOUS | Status: DC
  Administered 2017-06-06: 1250 mg via INTRAVENOUS
  Filled 2017-06-05: qty 1250

## 2017-06-05 MED ORDER — ACETAMINOPHEN 325 MG PO TABS
650.0000 mg | ORAL_TABLET | Freq: Four times a day (QID) | ORAL | Status: DC | PRN
  Administered 2017-06-07: 650 mg via ORAL
  Filled 2017-06-05: qty 2

## 2017-06-05 MED ORDER — ACETAMINOPHEN 650 MG RE SUPP: 650.0000 mg | Freq: Four times a day (QID) | RECTAL | Status: DC | PRN

## 2017-06-05 MED ORDER — ONDANSETRON HCL 4 MG/2ML IJ SOLN
4.0000 mg | Freq: Four times a day (QID) | INTRAMUSCULAR | Status: DC | PRN
  Administered 2017-06-06 – 2017-06-08 (×7): 4 mg via INTRAVENOUS
  Filled 2017-06-05 (×7): qty 2

## 2017-06-05 MED ORDER — VANCOMYCIN HCL IN DEXTROSE 1-5 GM/200ML-% IV SOLN
1000.0000 mg | Freq: Three times a day (TID) | INTRAVENOUS | Status: DC
  Administered 2017-06-05: 500 mg via INTRAVENOUS

## 2017-06-05 MED ORDER — VANCOMYCIN HCL 500 MG IV SOLR
INTRAVENOUS | Status: AC
Start: 2017-06-05 — End: 2017-06-06
  Filled 2017-06-05: qty 1500

## 2017-06-05 MED ORDER — ACETAMINOPHEN 325 MG PO TABS
650.0000 mg | ORAL_TABLET | Freq: Once | ORAL | Status: AC
  Administered 2017-06-05: 650 mg via ORAL
  Filled 2017-06-05: qty 2

## 2017-06-05 MED ORDER — ENOXAPARIN SODIUM 40 MG/0.4ML ~~LOC~~ SOLN
40.0000 mg | SUBCUTANEOUS | Status: DC
  Administered 2017-06-06 – 2017-06-07 (×2): 40 mg via SUBCUTANEOUS
  Filled 2017-06-05 (×3): qty 0.4

## 2017-06-05 MED ORDER — ONDANSETRON HCL 4 MG PO TABS: 4.0000 mg | ORAL_TABLET | Freq: Four times a day (QID) | ORAL | Status: DC | PRN

## 2017-06-05 MED ORDER — SODIUM CHLORIDE 0.9 % IV SOLN
1000.0000 mL | INTRAVENOUS | Status: DC
  Administered 2017-06-05 (×2): 1000 mL via INTRAVENOUS

## 2017-06-05 MED ORDER — PIPERACILLIN-TAZOBACTAM 3.375 G IVPB
3.3750 g | Freq: Three times a day (TID) | INTRAVENOUS | Status: DC
  Administered 2017-06-05 – 2017-06-06 (×2): 3.375 g via INTRAVENOUS
  Filled 2017-06-05 (×2): qty 50

## 2017-06-05 MED ORDER — SODIUM CHLORIDE 0.9 % IV BOLUS (SEPSIS)
500.0000 mL | Freq: Once | INTRAVENOUS | Status: AC
  Administered 2017-06-05: 500 mL via INTRAVENOUS

## 2017-06-05 MED ORDER — SODIUM CHLORIDE 0.9 % IV BOLUS (SEPSIS)
1000.0000 mL | Freq: Once | INTRAVENOUS | Status: AC
  Administered 2017-06-05: 1000 mL via INTRAVENOUS

## 2017-06-05 MED ORDER — PIPERACILLIN-TAZOBACTAM 3.375 G IVPB 30 MIN
3.3750 g | Freq: Once | INTRAVENOUS | Status: AC
  Administered 2017-06-05: 3.375 g via INTRAVENOUS
  Filled 2017-06-05 (×2): qty 50

## 2017-06-05 NOTE — ED Triage Notes (Signed)
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 Patient is pale and having a hard time keeping his eyes awake. Patient last shot up dope a few hours ago.

## 2017-06-05 NOTE — ED Notes (Signed)
Pts mother [facility]

## 2017-06-05 NOTE — ED Notes (Signed)
Paged Dr. Onalee Huaavid

## 2017-06-05 NOTE — ED Provider Notes (Signed)
[facility]-ICU/STEPDOWN Provider Note   CSN: [id number] Arrival date & time: 06/05/17  1603     History   Chief Complaint Chief Complaint  Patient presents with  . Generalized Body Aches    HPI David [name] is a 36 y.o. male with past medical history of heroin abuse, hepatitis C, presenting to the ED with acute onset of generalized myalgias that began a few hours ago.  Patient states he is injected heroin twice today, the last being a few hours ago. Reports that he filters his heroin before use using a cotton ball. 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  His only symptoms today are generalized body aches in his arms and legs and waistline. Denies chest pain, shortness of breath, URI symptoms, abdominal pain, nausea, urinary symptoms, sores or skin infections. Denies any other drug use today.  Accompanied by his mother.  The history is provided by the patient and a parent.    Past Medical History:  Diagnosis Date  . Dialysis care    no longer in dialysis  . Hepatitis C antibody positive in blood   . Heroin abuse (HCC)   . Kidney stones   . Polysubstance abuse [facility](HCC)     Patient Active Problem List   Diagnosis Date Noted  . Sepsis (HCC) 06/05/2017  . Pain and swelling of right ankle   . Tobacco abuse   . Pyogenic arthritis of ankle (HCC)   . Polysubstance abuse (HCC) 12/06/2016  . Synovitis of right ankle 12/06/2016  . Heroin abuse (HCC) 09/07/2013    Past Surgical History:  Procedure Laterality Date  . I&D EXTREMITY Left 09/07/2013   Procedure: IRRIGATION AND DEBRIDEMENT EXTREMITY;  Surgeon: Dominica SeverinWilliam Gramig, MD;  Location: [facility] OR;  Service: Orthopedics;  Laterality: Left;  . I&D EXTREMITY Right 12/07/2016   Procedure: IRRIGATION AND DEBRIDEMENT ANKLE;  Surgeon: [name], [name], MD;  Location: [facility] OR;  Service: Orthopedics;  Laterality: Right;       Home Medications    Prior to Admission medications   Not on  File    Family History Family History  Problem Relation Age of Onset  . Diabetes type II Mother   . Drug abuse Father     Social History Social History   Tobacco Use  . Smoking status: Current Every Day Smoker    Packs/day: 1.00    Types: Cigarettes  . Smokeless tobacco: Never Used  Substance Use Topics  . Alcohol use: Yes    Comment: denies 07/07/16  . Drug use: Yes    Types: Marijuana    Comment: Meth a few days ago.     Allergies   Patient has no known allergies.   Review of Systems Review of Systems  Constitutional: Positive for fever.  HENT: Negative for congestion and sore throat.   Respiratory: Negative for cough and shortness of breath.   Cardiovascular: Negative for chest pain.  Gastrointestinal: Negative for abdominal pain and nausea.  Genitourinary: Negative for dysuria and frequency.  Musculoskeletal: Positive for myalgias (Generalized).  Skin: Negative for wound.  All other systems reviewed and are negative.    Physical Exam Updated Vital Signs BP 118/77   Pulse (!) 104   Temp 98.4 F (36.9 C) (Oral)   Resp (!) 33   Ht 5\' 10"  (1.778 m)   Wt 79.4 kg (175 lb)   SpO2 98%   BMI 25.11 kg/m   Physical Exam  Constitutional: He appears  well-developed and well-nourished.  Patient appears pale, and sleepy, however is arousable to verbal stimuli and conversive.  HENT:  Head: Normocephalic and atraumatic.  Mouth/Throat: Oropharynx is clear and moist.  No Oral lesions.  Eyes: Conjunctivae and EOM are normal.  Miotic pupils bilaterally.  Neck: Normal range of motion. Neck supple. No JVD present. No tracheal deviation present.  Cardiovascular: Regular rhythm, normal heart sounds and intact distal pulses.  No murmur heard. Tachycardic  Pulmonary/Chest: Effort normal and breath sounds normal. No stridor. No respiratory distress. He has no wheezes. He has no rales.  Abdominal: Soft. Bowel sounds are normal. He exhibits no distension. There is no  tenderness. There is no rebound and no guarding.  Musculoskeletal: Normal range of motion.  No subungual splinter hemorrhages or lesions on hands or feet.  Lymphadenopathy:    He has no cervical adenopathy.  Neurological: GCS eye subscore is 3. GCS verbal subscore is 5. GCS motor subscore is 6.  Cranial nerves grossly intact.  Skin: Skin is warm. He is not diaphoretic.  No wounds or evidence of skin infection.  Psychiatric: He has a normal mood and affect. His behavior is normal.  Nursing note and vitals reviewed.    ED Treatments / Results  Labs (all labs ordered are listed, but only abnormal results are displayed) Labs Reviewed  COMPREHENSIVE METABOLIC PANEL - Abnormal; Notable for the following components:      Result Value   Potassium 3.2 (*)    Chloride 100 (*)    Glucose, Bld 107 (*)    AST 86 (*)    ALT 70 (*)    Alkaline Phosphatase 184 (*)    Total Bilirubin 1.5 (*)    All other components within normal limits  CBC WITH DIFFERENTIAL/PLATELET - Abnormal; Notable for the following components:   HCT 38.8 (*)    Platelets 146 (*)    Neutro Abs 7.9 (*)    Lymphs Abs 0.2 (*)    Monocytes Absolute 0.0 (*)    All other components within normal limits  URINALYSIS, ROUTINE W REFLEX MICROSCOPIC - Abnormal; Notable for the following components:   Specific Gravity, Urine <1.005 (*)    All other components within normal limits  LACTIC ACID, PLASMA - Abnormal; Notable for the following components:   Lactic Acid, Venous 3.2 (*)    All other components within normal limits  PROTIME-INR - Abnormal; Notable for the following components:   Prothrombin Time 17.3 (*)    All other components within normal limits  APTT - Abnormal; Notable for the following components:   aPTT 38 (*)    All other components within normal limits  CBC - Abnormal; Notable for the following components:   WBC 23.9 (*)    RBC 3.81 (*)    Hemoglobin 11.3 (*)    HCT 33.3 (*)    Platelets 134 (*)    All  other components within normal limits  I-STAT CG4 LACTIC ACID, ED - Abnormal; Notable for the following components:   Lactic Acid, Venous 5.91 (*)    All other components within normal limits  I-STAT CG4 LACTIC ACID, ED - Abnormal; Notable for the following components:   Lactic Acid, Venous 4.26 (*)    All other components within normal limits  MRSA PCR SCREENING  CULTURE, BLOOD (ROUTINE X 2)  CULTURE, BLOOD (ROUTINE X 2)  URINE CULTURE  PROCALCITONIN  CREATININE, SERUM  CBC WITH DIFFERENTIAL/PLATELET  COMPREHENSIVE METABOLIC PANEL  LACTIC ACID, PLASMA    EKG  EKG Interpretation  Date/Time:  Thursday June 05 2017 16:25:20 EST Ventricular Rate:  125 PR Interval:    QRS Duration: 85 QT Interval:  286 QTC Calculation: 413 R Axis:   82 Text Interpretation:  Sinus tachycardia Paired ventricular premature complexes Aberrant conduction of SV complex(es) Baseline wander in lead(s) V3 When compared to prior, faster rate.  No STEMI Confirmed by Theda Belfast ([id number]) on 06/05/2017 5:09:50 PM       Radiology Dg Chest 2 View  Result Date: 06/05/2017 CLINICAL DATA:  Fever. EXAM: CHEST  2 VIEW COMPARISON:  Radiograph of November 18, 2015. FINDINGS: The heart size and mediastinal contours are within normal limits. Both lungs are clear. No pneumothorax or pleural effusion is noted. The visualized skeletal structures are unremarkable. IMPRESSION: No active cardiopulmonary disease. Electronically Signed   By: Lupita Raider, M.D.   On: 06/05/2017 17:27    Procedures Procedures (including critical care time)  CRITICAL CARE Performed by: [place] N Devorah Givhan   Total critical care time: 30 minutes  Critical care time was exclusive of separately billable procedures and treating other patients.  Critical care was necessary to treat or prevent imminent or life-threatening deterioration.  Critical care was time spent personally by me on the following activities: development of treatment  plan with patient and/or surrogate as well as nursing, discussions with consultants, evaluation of patient's response to treatment, examination of patient, obtaining history from patient or surrogate, ordering and performing treatments and interventions, ordering and review of laboratory studies, ordering and review of radiographic studies, pulse oximetry and re-evaluation of patient's condition.   Medications Ordered in ED Medications  vancomycin (VANCOCIN) 500 MG powder (  Not Given 06/05/17 1745)  piperacillin-tazobactam (ZOSYN) IVPB 3.375 g (3.375 g Intravenous New Bag/Given 06/05/17 2310)  vancomycin (VANCOCIN) 1,250 mg in sodium chloride 0.9 % 250 mL IVPB (not administered)  acetaminophen (TYLENOL) tablet 650 mg (not administered)    Or  acetaminophen (TYLENOL) suppository 650 mg (not administered)  ondansetron (ZOFRAN) tablet 4 mg (not administered)    Or  ondansetron (ZOFRAN) injection 4 mg (not administered)  enoxaparin (LOVENOX) injection 40 mg (40 mg Subcutaneous Given 06/05/17 2300)  sodium chloride 0.9 % bolus 1,000 mL (0 mLs Intravenous Stopped 06/05/17 1803)    And  sodium chloride 0.9 % bolus 1,000 mL (0 mLs Intravenous Stopped 06/05/17 1858)    And  sodium chloride 0.9 % bolus 500 mL (0 mLs Intravenous Stopped 06/05/17 1952)  piperacillin-tazobactam (ZOSYN) IVPB 3.375 g (0 g Intravenous Stopped 06/05/17 1805)  vancomycin (VANCOCIN) 1,500 mg in sodium chloride 0.9 % 500 mL IVPB (0 mg Intravenous Stopped 06/05/17 1913)  acetaminophen (TYLENOL) tablet 650 mg (650 mg Oral Given 06/05/17 1755)     Initial Impression / Assessment and Plan / ED Course  I have reviewed the triage vital signs and the nursing notes.  Pertinent labs & imaging results that were available during my care of the patient were reviewed by me and considered in my medical decision making (see chart for details).  Clinical Course as of Jun 06 104  Thu Jun 05, 2017  6045 Dr. Onalee Hua with [facility] accepting  admission to [facility].  [JR]    Clinical Course User Index [JR] Emogene Muratalla, [place] N, PA-C    Pt with hx of IVDU, last used a few hours PTA, presenting with acute onset of fever and myalgias. Pt febrile and tachycardic in 130s on arrival. Pt sleepy however easily arousable to verbal stimuli, oriented.  Code sepsis  initiated. Lungs CTAB. No new murmur on exam. Abd benign. No evidence of soft tissue infection. Vancomycin and Zosyn administered. Lactic elev 5.91.  30cc/kg fluids given, blood cultures pending. CXR neg. WBC wnl. Liver enzymes slightly elevated, likely 2/t hepatitis C or dehydration.  Urine neg. Potassium slightly low at 3.2. Pt became slightly hypotensive to 93/66, however with improvement after continued IV fluids. Repeat lactic after 4L of fluids 4.26. Pt continues to be tachycardic and with tachypnea. Dr. Onalee Hua with [facility] accepting admission, pt to be transferred to [facility] for admission.  Patient discussed with Dr. Rush Landmark.  The patient appears reasonably stabilized for admission considering the current resources, flow, and capabilities available in the ED at this time, and I doubt any other [facility] requiring further screening and/or treatment in the ED prior to admission.  Final Clinical Impressions(s) / ED Diagnoses   Final diagnoses:  Sepsis, due to unspecified organism [facility])    ED Discharge Orders    None       Maitland Lesiak, [place] N, PA-C 06/06/17 0106    Jeannette Maddy, [place] N, PA-C 06/06/17 0107    Tegeler, Canary Brim, MD 06/06/17 [phone]

## 2017-06-05 NOTE — H&P (Signed)
History and Physical    David SchlatterRichard Bertran ZOX:[id number] DOB: 1980-10-24 DOA: 06/05/2017  PCP: Patient, No Pcp Per  Patient coming from: Home.  Chief Complaint: Generalized body ache.  HPI: David [name] is a 36 y.o. male with history of IV drug abuse presents to the ER after patient started developing generalized body ache fever and chills.  Patient states that he uses IV drugs heroin twice daily and today started developing body aches a few hours after injecting.  Since it persisted patient came to the ER.  Patient was admitted in May of this year for infection involving the ankle joint which required debridement.  ED Course: The ER patient was found to be febrile with lactate levels around 5.9.  Blood cultures were obtained chest x-ray and UA were unremarkable.  Patient was given sepsis protocol fluid bolus and started on empiric antibiotics.  On arrival to the hospital patient appears lethargic but follows commands.  Patient still tachycardic.  Patient admitted for sepsis concerning for bacteremia.  Review of Systems: As per HPI, rest all negative.   Past Medical History:  Diagnosis Date  . Dialysis care    no longer in dialysis  . Hepatitis C antibody positive in blood   . Heroin abuse (HCC)   . Kidney stones   . Polysubstance abuse [facility](HCC)     Past Surgical History:  Procedure Laterality Date  . I&D EXTREMITY Left 09/07/2013   Procedure: IRRIGATION AND DEBRIDEMENT EXTREMITY;  Surgeon: Dominica SeverinWilliam Gramig, MD;  Location: [facility] OR;  Service: Orthopedics;  Laterality: Left;  . I&D EXTREMITY Right 12/07/2016   Procedure: IRRIGATION AND DEBRIDEMENT ANKLE;  Surgeon: [name], [name], MD;  Location: [facility] OR;  Service: Orthopedics;  Laterality: Right;     reports that he has been smoking cigarettes.  He has been smoking about 1.00 pack per day. he has never used smokeless tobacco. He reports that he drinks alcohol. He reports that he uses drugs. Drug: Marijuana.  No Known Allergies  Family  History  Problem Relation Age of Onset  . Diabetes type II Mother   . Drug abuse Father     Prior to Admission medications   Medication Sig Start Date End Date Taking? Authorizing Provider  acetaminophen (TYLENOL) 325 MG tablet Take 2 tablets (650 mg total) by mouth every 6 (six) hours as needed for mild pain or fever. 12/09/16   Vassie LollMadera, Carlos, MD  diclofenac (VOLTAREN) 75 MG EC tablet Take 1 tablet (75 mg total) by mouth every 8 (eight) hours as needed (severe pain). 12/09/16   Vassie LollMadera, Carlos, MD  nicotine (NICODERM CQ - DOSED IN MG/24 HOURS) 21 mg/24hr patch Place 1 patch (21 mg total) onto the skin daily. 12/10/16   Vassie LollMadera, Carlos, MD    Physical Exam: Vitals:   06/05/17 1900 06/05/17 2000 06/05/17 2006 06/05/17 2141  BP: 106/71 106/65    Pulse: (!) 123 (!) 120 (!) 118   Resp: (!) 25 (!) 29    Temp:   (!) 100.5 F (38.1 C) 98.7 F (37.1 C)  TempSrc:   Oral Oral  SpO2: 97% 99%    Weight:      Height:          Constitutional: Moderately built and nourished. Vitals:   06/05/17 1900 06/05/17 2000 06/05/17 2006 06/05/17 2141  BP: 106/71 106/65    Pulse: (!) 123 (!) 120 (!) 118   Resp: (!) 25 (!) 29    Temp:   (!) 100.5 F (38.1 C) 98.7 F (37.1  C)  TempSrc:   Oral Oral  SpO2: 97% 99%    Weight:      Height:       Eyes: Anicteric no pallor. ENMT: No discharge from the ears eyes nose or mouth. Neck: No mass felt.  No neck rigidity. Respiratory: No rhonchi or crepitations. Cardiovascular: S1-S2 heard no murmurs appreciated. Abdomen: Soft nontender bowel sounds present. Musculoskeletal: No edema. Skin: No obvious rash. Neurologic: Patient is lethargic but follows commands.  He states he is here for body aches.  Moves all extremities. Psychiatric: Mildly lethargic.   Labs on Admission: I have personally reviewed following labs and imaging studies  CBC: Recent Labs  Lab 06/05/17 1638  WBC 8.2  NEUTROABS 7.9*  HGB 13.2  HCT 38.8*  MCV 88.0  PLT 146*   Basic  Metabolic Panel: Recent Labs  Lab 06/05/17 1638  NA 135  K 3.2*  CL 100*  CO2 27  GLUCOSE 107*  BUN 10  CREATININE 0.74  CALCIUM 9.4   GFR: Estimated Creatinine Clearance: 131.8 mL/min (by C-G formula based on SCr of 0.74 mg/dL). Liver Function Tests: Recent Labs  Lab 06/05/17 1638  AST 86*  ALT 70*  ALKPHOS 184*  BILITOT 1.5*  PROT 7.0  ALBUMIN 3.6   No results for input(s): LIPASE, AMYLASE in the last 168 hours. No results for input(s): AMMONIA in the last 168 hours. Coagulation Profile: No results for input(s): INR, PROTIME in the last 168 hours. Cardiac Enzymes: No results for input(s): CKTOTAL, CKMB, CKMBINDEX, TROPONINI in the last 168 hours. BNP (last 3 results) No results for input(s): PROBNP in the last 8760 hours. HbA1C: No results for input(s): HGBA1C in the last 72 hours. CBG: No results for input(s): GLUCAP in the last 168 hours. Lipid Profile: No results for input(s): CHOL, HDL, LDLCALC, TRIG, CHOLHDL, LDLDIRECT in the last 72 hours. Thyroid Function Tests: No results for input(s): TSH, T4TOTAL, FREET4, T3FREE, THYROIDAB in the last 72 hours. Anemia Panel: No results for input(s): VITAMINB12, FOLATE, FERRITIN, TIBC, IRON, RETICCTPCT in the last 72 hours. Urine analysis:    Component Value Date/Time   COLORURINE YELLOW 06/05/2017 1749   APPEARANCEUR CLEAR 06/05/2017 1749   LABSPEC <1.005 (L) 06/05/2017 1749   PHURINE 6.0 06/05/2017 1749   GLUCOSEU NEGATIVE 06/05/2017 1749   HGBUR NEGATIVE 06/05/2017 1749   BILIRUBINUR NEGATIVE 06/05/2017 1749   KETONESUR NEGATIVE 06/05/2017 1749   PROTEINUR NEGATIVE 06/05/2017 1749   UROBILINOGEN 1.0 06/29/2007 0150   NITRITE NEGATIVE 06/05/2017 1749   LEUKOCYTESUR NEGATIVE 06/05/2017 1749   Sepsis Labs: @LABRCNTIP (procalcitonin:4,lacticidven:4) )No results found for this or any previous visit (from the past 240 hour(s)).   Radiological Exams on Admission: Dg Chest 2 View  Result Date:  06/05/2017 CLINICAL DATA:  Fever. EXAM: CHEST  2 VIEW COMPARISON:  Radiograph of November 18, 2015. FINDINGS: The heart size and mediastinal contours are within normal limits. Both lungs are clear. No pneumothorax or pleural effusion is noted. The visualized skeletal structures are unremarkable. IMPRESSION: No active cardiopulmonary disease. Electronically Signed   By: Lupita Raider, M.D.   On: 06/05/2017 17:27    EKG: Independently reviewed.  Sinus tachycardia.  Assessment/Plan Principal Problem:   Sepsis (HCC) Active Problems:   Polysubstance abuse (HCC)    1. Sepsis -source not clear but patient has history of IV drug abuse concerning for bacteremia/endocarditis.  Patient is on vancomycin and Zosyn for now.  Follow cultures continue to hydrate follow lactic acid level pro calcitonin levels. 2. IV  drug abuse with signs of withdrawal -patient is placed on CIWA protocol.  Closely monitor.  Will need counseling once patient is more alert awake.  If patient continues to remain confused will order CT of the head. 3. Thrombocytopenia -likely from infectious cause.  Follow CBC. 4. History of hepatitis C and B as per the chart.  I have reviewed patient's old charts and labs.   DVT prophylaxis: Lovenox. Code Status: Full code. Family Communication: No family at the bedside. Disposition Plan: To be determined. Consults called: None. Admission status: Inpatient.   Eduard ClosArshad N Tinlee Navarrette MD [facility] Pager [phone].  If 7PM-7AM, please contact night-coverage [email or web address] Password [email or web address]  06/05/2017, 9:42 PM

## 2017-06-05 NOTE — Progress Notes (Signed)
Pharmacy Antibiotic Note  David SchlatterRichard [name] is a 36 y.o. male admitted on 06/05/2017 with sepsis.  Pharmacy has been consulted for Vanc/Zosyn dosing.  100.4degF, Pulse rate of 123, Resp 22, WBC 8.2, Scr 0.74, LA 5.91   Plan: Vanc 1500mg  IV X1 THEN 1000mg  Q8H IV  Zosyn 3.375g IV X1 30 min infusion then 3.375g IV Q8H   F/U renal function, clinical status, C&S, vanc trough at SS   Height: 5\' 10"  (177.8 cm) Weight: 175 lb (79.4 kg) IBW/kg (Calculated) : 73  Temp (24hrs), Avg:100.4 F (38 C), Min:100.4 F (38 C), Max:100.4 F (38 C)  Recent Labs  Lab 06/05/17 1638 06/05/17 1655  WBC 8.2  --   CREATININE 0.74  --   LATICACIDVEN  --  5.91*    Estimated Creatinine Clearance: 131.8 mL/min (by C-G formula based on SCr of 0.74 mg/dL).    No Known Allergies  Antimicrobials this admission: Vanc 11/15 >> Zosyn 11/15 >>   Dose adjustments this admission:  Microbiology results:   Thank you for allowing pharmacy to be a part of this patient's care.  David DupesMedina Joi [name]  PharmD Candidate '19 05/26/2017 3:30 PM

## 2017-06-05 NOTE — Progress Notes (Signed)
Pharmacy Antibiotic Note  David SchlatterRichard [name] is a 36 y.o. male admitted on 06/05/2017 with sepsis.  Pharmacy has been consulted for Vanc/Zosyn dosing. [facility]  Plan: 1) Based on current patient data, will change vancomycin from 1g IV q8 to 1250mg  IV q12 to give an est AUC of 430 using SCr of 0.8 2) Continue current Zosyn dosing  F/U renal function, clinical status, C&S, vanc trough at SS   Height: 5\' 10"  (177.8 cm) Weight: 175 lb (79.4 kg) IBW/kg (Calculated) : 73  Temp (24hrs), Avg:100.5 F (38.1 C), Min:100.4 F (38 C), Max:100.5 F (38.1 C)  Recent Labs  Lab 06/05/17 1638 06/05/17 1655 06/05/17 1900  WBC 8.2  --   --   CREATININE 0.74  --   --   LATICACIDVEN  --  5.91* 4.26*    Estimated Creatinine Clearance: 131.8 mL/min (by C-G formula based on SCr of 0.74 mg/dL).    No Known Allergies  Antimicrobials this admission: Vanc 11/15 >> Zosyn 11/15 >>   Dose adjustments this admission:  Microbiology results:   Thank you for allowing pharmacy to be a part of this patient's care.  Hessie KnowsJustin M Kenya Shiraishi, PharmD, BCPS Pager [phone] 06/05/2017 9:14 PM

## 2017-06-06 ENCOUNTER — Other Ambulatory Visit: Payer: Self-pay

## 2017-06-06 ENCOUNTER — Inpatient Hospital Stay (HOSPITAL_COMMUNITY): Payer: Self-pay

## 2017-06-06 ENCOUNTER — Encounter (HOSPITAL_COMMUNITY): Payer: Self-pay | Admitting: Radiology

## 2017-06-06 DIAGNOSIS — B182 Chronic viral hepatitis C: Secondary | ICD-10-CM

## 2017-06-06 DIAGNOSIS — F329 Major depressive disorder, single episode, unspecified: Secondary | ICD-10-CM

## 2017-06-06 DIAGNOSIS — A4101 Sepsis due to Methicillin susceptible Staphylococcus aureus: Principal | ICD-10-CM

## 2017-06-06 DIAGNOSIS — R7881 Bacteremia: Secondary | ICD-10-CM

## 2017-06-06 DIAGNOSIS — R740 Nonspecific elevation of levels of transaminase and lactic acid dehydrogenase [LDH]: Secondary | ICD-10-CM

## 2017-06-06 LAB — BLOOD CULTURE ID PANEL (REFLEXED)
ACINETOBACTER BAUMANNII: NOT DETECTED
ACINETOBACTER BAUMANNII: NOT DETECTED
CANDIDA ALBICANS: NOT DETECTED
CANDIDA GLABRATA: NOT DETECTED
CANDIDA KRUSEI: NOT DETECTED
CANDIDA PARAPSILOSIS: NOT DETECTED
CANDIDA TROPICALIS: NOT DETECTED
Candida albicans: NOT DETECTED
Candida glabrata: NOT DETECTED
Candida krusei: NOT DETECTED
Candida parapsilosis: NOT DETECTED
Candida tropicalis: NOT DETECTED
ENTEROBACTER CLOACAE COMPLEX: NOT DETECTED
ENTEROBACTERIACEAE SPECIES: NOT DETECTED
ENTEROCOCCUS SPECIES: NOT DETECTED
ESCHERICHIA COLI: NOT DETECTED
ESCHERICHIA COLI: NOT DETECTED
Enterobacter cloacae complex: NOT DETECTED
Enterobacteriaceae species: NOT DETECTED
Enterococcus species: NOT DETECTED
HAEMOPHILUS INFLUENZAE: NOT DETECTED
Haemophilus influenzae: NOT DETECTED
KLEBSIELLA OXYTOCA: NOT DETECTED
KLEBSIELLA PNEUMONIAE: NOT DETECTED
Klebsiella oxytoca: NOT DETECTED
Klebsiella pneumoniae: NOT DETECTED
LISTERIA MONOCYTOGENES: NOT DETECTED
Listeria monocytogenes: NOT DETECTED
METHICILLIN RESISTANCE: NOT DETECTED
Methicillin resistance: NOT DETECTED
NEISSERIA MENINGITIDIS: NOT DETECTED
Neisseria meningitidis: NOT DETECTED
PSEUDOMONAS AERUGINOSA: NOT DETECTED
PSEUDOMONAS AERUGINOSA: NOT DETECTED
Proteus species: NOT DETECTED
Proteus species: NOT DETECTED
SERRATIA MARCESCENS: NOT DETECTED
STAPHYLOCOCCUS AUREUS BCID: DETECTED — AB
STREPTOCOCCUS AGALACTIAE: NOT DETECTED
STREPTOCOCCUS PNEUMONIAE: NOT DETECTED
STREPTOCOCCUS PNEUMONIAE: NOT DETECTED
STREPTOCOCCUS PYOGENES: NOT DETECTED
STREPTOCOCCUS SPECIES: NOT DETECTED
Serratia marcescens: NOT DETECTED
Staphylococcus aureus (BCID): DETECTED — AB
Staphylococcus species: DETECTED — AB
Staphylococcus species: DETECTED — AB
Streptococcus agalactiae: NOT DETECTED
Streptococcus pyogenes: NOT DETECTED
Streptococcus species: NOT DETECTED

## 2017-06-06 LAB — ECHOCARDIOGRAM COMPLETE
Height: 70 in
Weight: 2331.58 oz

## 2017-06-06 LAB — RAPID URINE DRUG SCREEN, HOSP PERFORMED
Amphetamines: POSITIVE — AB
Barbiturates: NOT DETECTED
Benzodiazepines: POSITIVE — AB
Cocaine: NOT DETECTED
OPIATES: NOT DETECTED
TETRAHYDROCANNABINOL: NOT DETECTED

## 2017-06-06 LAB — CBC WITH DIFFERENTIAL/PLATELET
BASOS PCT: 0 %
Basophils Absolute: 0 10*3/uL (ref 0.0–0.1)
EOS ABS: 0 10*3/uL (ref 0.0–0.7)
Eosinophils Relative: 0 %
HCT: 32.5 % — ABNORMAL LOW (ref 39.0–52.0)
Hemoglobin: 11.3 g/dL — ABNORMAL LOW (ref 13.0–17.0)
Lymphocytes Relative: 2 %
Lymphs Abs: 0.6 10*3/uL — ABNORMAL LOW (ref 0.7–4.0)
MCH: 30.1 pg (ref 26.0–34.0)
MCHC: 34.8 g/dL (ref 30.0–36.0)
MCV: 86.7 fL (ref 78.0–100.0)
Monocytes Absolute: 1.6 10*3/uL — ABNORMAL HIGH (ref 0.1–1.0)
Monocytes Relative: 6 %
NEUTROS PCT: 92 %
Neutro Abs: 24.7 10*3/uL — ABNORMAL HIGH (ref 1.7–7.7)
PLATELETS: 130 10*3/uL — AB (ref 150–400)
RBC: 3.75 MIL/uL — AB (ref 4.22–5.81)
RDW: 13.2 % (ref 11.5–15.5)
WBC Morphology: INCREASED
WBC: 26.1 10*3/uL — AB (ref 4.0–10.5)

## 2017-06-06 LAB — COMPREHENSIVE METABOLIC PANEL
ALT: 51 U/L (ref 17–63)
AST: 58 U/L — AB (ref 15–41)
Albumin: 2.8 g/dL — ABNORMAL LOW (ref 3.5–5.0)
Alkaline Phosphatase: 102 U/L (ref 38–126)
Anion gap: 4 — ABNORMAL LOW (ref 5–15)
BUN: 9 mg/dL (ref 6–20)
CHLORIDE: 112 mmol/L — AB (ref 101–111)
CO2: 22 mmol/L (ref 22–32)
CREATININE: 0.83 mg/dL (ref 0.61–1.24)
Calcium: 8.3 mg/dL — ABNORMAL LOW (ref 8.9–10.3)
GFR calc non Af Amer: >60 mL/min (ref >60)
Glucose, Bld: 138 mg/dL — ABNORMAL HIGH (ref 65–99)
Potassium: 3.8 mmol/L (ref 3.5–5.1)
SODIUM: 138 mmol/L (ref 135–145)
Total Bilirubin: 0.8 mg/dL (ref 0.3–1.2)
Total Protein: 5.3 g/dL — ABNORMAL LOW (ref 6.5–8.1)

## 2017-06-06 LAB — LACTIC ACID, PLASMA: Lactic Acid, Venous: 3.1 mmol/L (ref 0.5–1.9)

## 2017-06-06 LAB — AMMONIA: AMMONIA: 33 umol/L (ref 9–35)

## 2017-06-06 MED ORDER — NALOXONE HCL 0.4 MG/ML IJ SOLN: 0.4000 mg | Freq: Once | INTRAMUSCULAR | Status: DC

## 2017-06-06 MED ORDER — CEFAZOLIN SODIUM-DEXTROSE 2-4 GM/100ML-% IV SOLN
2.0000 g | Freq: Three times a day (TID) | INTRAVENOUS | Status: DC
  Administered 2017-06-06 – 2017-06-08 (×6): 2 g via INTRAVENOUS
  Filled 2017-06-06 (×10): qty 100

## 2017-06-06 MED ORDER — LORAZEPAM 2 MG/ML IJ SOLN
1.0000 mg | Freq: Four times a day (QID) | INTRAMUSCULAR | Status: DC | PRN
  Administered 2017-06-06: 1 mg via INTRAVENOUS
  Filled 2017-06-06: qty 1

## 2017-06-06 MED ORDER — NALOXONE HCL 0.4 MG/ML IJ SOLN: 0.4000 mg | INTRAMUSCULAR | Status: DC | PRN

## 2017-06-06 MED ORDER — VITAMIN B-1 100 MG PO TABS: 100.0000 mg | ORAL_TABLET | Freq: Every day | ORAL | Status: DC

## 2017-06-06 MED ORDER — LORAZEPAM 2 MG/ML IJ SOLN
2.0000 mg | INTRAMUSCULAR | Status: DC | PRN
  Administered 2017-06-06 (×2): 2 mg via INTRAVENOUS
  Administered 2017-06-06 (×2): 3 mg via INTRAVENOUS
  Administered 2017-06-06 – 2017-06-08 (×11): 2 mg via INTRAVENOUS
  Administered 2017-06-08: 3 mg via INTRAVENOUS
  Filled 2017-06-06: qty 2
  Filled 2017-06-06 (×2): qty 1
  Filled 2017-06-06: qty 2
  Filled 2017-06-06 (×4): qty 1
  Filled 2017-06-06: qty 2
  Filled 2017-06-06 (×6): qty 1

## 2017-06-06 MED ORDER — FOLIC ACID 5 MG/ML IJ SOLN
1.0000 mg | Freq: Every day | INTRAMUSCULAR | Status: DC
  Filled 2017-06-06: qty 0.2

## 2017-06-06 MED ORDER — ADULT MULTIVITAMIN W/MINERALS CH
1.0000 | ORAL_TABLET | Freq: Every day | ORAL | Status: DC
  Administered 2017-06-06 – 2017-06-08 (×3): 1 via ORAL
  Filled 2017-06-06 (×3): qty 1

## 2017-06-06 MED ORDER — LORAZEPAM 1 MG PO TABS: 1.0000 mg | ORAL_TABLET | Freq: Four times a day (QID) | ORAL | Status: DC | PRN

## 2017-06-06 MED ORDER — FOLIC ACID 1 MG PO TABS
1.0000 mg | ORAL_TABLET | Freq: Every day | ORAL | Status: DC
  Administered 2017-06-06 – 2017-06-08 (×3): 1 mg via ORAL
  Filled 2017-06-06 (×3): qty 1

## 2017-06-06 MED ORDER — THIAMINE HCL 100 MG/ML IJ SOLN: 100.0000 mg | Freq: Every day | INTRAMUSCULAR | Status: DC

## 2017-06-06 MED ORDER — THIAMINE HCL 100 MG/ML IJ SOLN
100.0000 mg | Freq: Every day | INTRAMUSCULAR | Status: DC
  Administered 2017-06-06 – 2017-06-07 (×2): 100 mg via INTRAVENOUS
  Filled 2017-06-06 (×2): qty 2

## 2017-06-06 NOTE — Progress Notes (Signed)
PHARMACY - PHYSICIAN COMMUNICATION CRITICAL VALUE ALERT - BLOOD CULTURE IDENTIFICATION (BCID)  Results for orders placed or performed during the hospital encounter of 06/05/17  Blood Culture ID Panel (Reflexed) (Collected: 06/05/2017  5:30 PM)  Result Value Ref Range   Enterococcus species NOT DETECTED NOT DETECTED   Listeria monocytogenes NOT DETECTED NOT DETECTED   Staphylococcus species DETECTED (A) NOT DETECTED   Staphylococcus aureus DETECTED (A) NOT DETECTED   Methicillin resistance NOT DETECTED NOT DETECTED   Streptococcus species NOT DETECTED NOT DETECTED   Streptococcus agalactiae NOT DETECTED NOT DETECTED   Streptococcus pneumoniae NOT DETECTED NOT DETECTED   Streptococcus pyogenes NOT DETECTED NOT DETECTED   Acinetobacter baumannii NOT DETECTED NOT DETECTED   Enterobacteriaceae species NOT DETECTED NOT DETECTED   Enterobacter cloacae complex NOT DETECTED NOT DETECTED   Escherichia coli NOT DETECTED NOT DETECTED   Klebsiella oxytoca NOT DETECTED NOT DETECTED   Klebsiella pneumoniae NOT DETECTED NOT DETECTED   Proteus species NOT DETECTED NOT DETECTED   Serratia marcescens NOT DETECTED NOT DETECTED   Haemophilus influenzae NOT DETECTED NOT DETECTED   Neisseria meningitidis NOT DETECTED NOT DETECTED   Pseudomonas aeruginosa NOT DETECTED NOT DETECTED   Candida albicans NOT DETECTED NOT DETECTED   Candida glabrata NOT DETECTED NOT DETECTED   Candida krusei NOT DETECTED NOT DETECTED   Candida parapsilosis NOT DETECTED NOT DETECTED   Candida tropicalis NOT DETECTED NOT DETECTED    Name of physician (or Provider) Contacted: Dr. Gwenlyn PerkingMadera  Changes to prescribed antibiotics required: Change to cefazolin 2g IV q8h  Clance BollRunyon, Bailey Kolbe 06/06/2017  12:02 PM

## 2017-06-06 NOTE — Consult Note (Addendum)
Date of Admission:  06/05/2017          Reason for Consult: MSSA bacteremia in pt with IVDU   Referring Provider: Connye Burkitt auto consult and Dr. Gwenlyn Perking   Assessment: 1. MSSA bacteremia with sepsis 2. Active IVDU 3. Poor insight 4. Prior Serratia septic arthritis of ankle 5. Hepatitis C  Plan: 1. Cefazolin  2. TTE and if he stays in the hospital would get TEE 3. Search for deep sites of infection 4. Ensure checking for HIV, viral hepatides 5. Refer to suboxone clinic at [place] 6. Check HCV RNA to see if he has cleared he had low level RNA at last check  Principal Problem:   Sepsis (HCC) Active Problems:   Polysubstance abuse (HCC)   Scheduled Meds: . enoxaparin (LOVENOX) injection  40 mg Subcutaneous Q24H  . folic acid  1 mg Oral Daily  . multivitamin with minerals  1 tablet Oral Daily  . thiamine  100 mg Intravenous Daily   Continuous Infusions: .  ceFAZolin (ANCEF) IV     PRN Meds:.acetaminophen **OR** acetaminophen, LORazepam, naLOXone (NARCAN)  injection, ondansetron **OR** ondansetron (ZOFRAN) IV  HPI: David [name] is a 36 y.o. male with active IV heroin abuse who prsented to the ED with boyd aches, malaise, fevers. He was septic in ED with lactic acid of 5,9, hypotensive. Blood cultures given, admitted and started on broad spectrum abx, blood cultures have subsequently grown MSSA and he has been narrowed to cefazolin.   He is agitated, inappropriate and with abysmal insight stating he wants to go home now. I told him that could have catastrophic consequences. He appears confused, ? Withdrawal and I doubt he can make competent decisions for himself.  I have ordered repeat blood cultures and a TTE. I dont find an obvious source on exam or by history but his agitation complicates the matter.   I will be on call this weekend and making rounds tomorrow am and then Sunday.    Review of Systems: Review of Systems  Constitutional: Positive for chills, diaphoresis,  fever and malaise/fatigue. Negative for weight loss.  HENT: Negative for congestion, hearing loss, sore throat and tinnitus.   Eyes: Negative for blurred vision, double vision, pain and redness.  Respiratory: Negative for cough, hemoptysis, sputum production, shortness of breath and wheezing.   Cardiovascular: Positive for palpitations. Negative for chest pain, claudication and leg swelling.  Gastrointestinal: Negative for abdominal pain, blood in stool, constipation, diarrhea, heartburn, melena, nausea and vomiting.  Genitourinary: Negative for dysuria, flank pain and hematuria.  Musculoskeletal: Positive for myalgias. Negative for back pain, falls and joint pain.  Skin: Negative for itching and rash.  Neurological: Positive for weakness. Negative for dizziness, sensory change, focal weakness, loss of consciousness and headaches.  Endo/Heme/Allergies: Does not bruise/bleed easily.  Psychiatric/Behavioral: Positive for depression. Negative for memory loss and suicidal ideas. The patient is not nervous/anxious.     Past Medical History:  Diagnosis Date  . Dialysis care    no longer in dialysis  . Hepatitis C antibody positive in blood   . Heroin abuse (HCC)   . Kidney stones   . Polysubstance abuse (HCC)     Social History   Tobacco Use  . Smoking status: Current Every Day Smoker    Packs/day: 1.00    Types: Cigarettes  . Smokeless tobacco: Never Used  Substance Use Topics  . Alcohol use: Yes    Comment: denies 07/07/16  . Drug use: Yes  Types: Marijuana    Comment: Meth a few days ago.    Family History  Problem Relation Age of Onset  . Diabetes type II Mother   . Drug abuse Father    No Known Allergies  OBJECTIVE: Blood pressure 128/78, pulse 91, temperature 98.6 F (37 C), temperature source Oral, resp. rate (!) 25, height 5\' 10"  (1.778 m), weight 145 lb 11.6 oz (66.1 kg), SpO2 92 %.  Physical Exam  Constitutional: He is oriented to person, place, and time. No  distress.  HENT:  Head: Normocephalic and atraumatic.  Right Ear: External ear normal.  Left Ear: External ear normal.  Nose: Nose normal.  Mouth/Throat: Oropharynx is clear and moist. No oropharyngeal exudate.  Eyes: Conjunctivae and EOM are normal. No scleral icterus.  Neck: Normal range of motion. Neck supple.  Cardiovascular: Regular rhythm and normal heart sounds. Tachycardia present. Exam reveals no gallop and no friction rub.  No murmur heard. Pulmonary/Chest: Effort normal and breath sounds normal. No respiratory distress. He has no wheezes. He has no rales. He exhibits no tenderness.  Abdominal: Soft. Bowel sounds are normal. He exhibits no distension. There is no tenderness. There is no rebound.  Musculoskeletal: Normal range of motion. He exhibits no edema or tenderness.  Lymphadenopathy:    He has no cervical adenopathy.  Neurological: He is alert and oriented to person, place, and time. Gait normal. Coordination normal.  Skin: Skin is warm. No rash noted. He is diaphoretic. There is erythema. No pallor.  Psychiatric: His affect is blunt and labile. He is agitated. He expresses impulsivity.    Lab Results Lab Results  Component Value Date   WBC 26.1 (H) 06/06/2017   HGB 11.3 (L) 06/06/2017   HCT 32.5 (L) 06/06/2017   MCV 86.7 06/06/2017   PLT 130 (L) 06/06/2017    Lab Results  Component Value Date   CREATININE 0.83 06/06/2017   BUN 9 06/06/2017   NA 138 06/06/2017   K 3.8 06/06/2017   CL 112 (H) 06/06/2017   CO2 22 06/06/2017    Lab Results  Component Value Date   ALT 51 06/06/2017   AST 58 (H) 06/06/2017   ALKPHOS 102 06/06/2017   BILITOT 0.8 06/06/2017     Microbiology: Recent Results (from the past 240 hour(s))  Blood Culture (routine x 2)     Status: None (Preliminary result)   Collection Time: 06/05/17  4:40 PM  Result Value Ref Range Status   Specimen Description BLOOD BLOOD LEFT HAND  Final   Special Requests   Final    BOTTLES DRAWN AEROBIC  AND ANAEROBIC Blood Culture adequate volume   Culture  Setup Time   Final    GRAM POSITIVE COCCI ANAEROBIC BOTTLE ONLY Organism ID to follow Performed at [facility], [address] [place], [place] [address]    Culture GRAM POSITIVE COCCI  Final   Report Status PENDING  Incomplete  Blood Culture (routine x 2)     Status: None (Preliminary result)   Collection Time: 06/05/17  5:30 PM  Result Value Ref Range Status   Specimen Description BLOOD LEFT ANTECUBITAL  Final   Special Requests   Final    IN PEDIATRIC BOTTLE Blood Culture results may not be optimal due to an excessive volume of blood received in culture bottles   Culture  Setup Time   Final    GRAM POSITIVE COCCI IN CLUSTERS IN PEDIATRIC BOTTLE Organism ID to follow CRITICAL RESULT CALLED TO, READ  BACK BY AND VERIFIED WITH: N. Glogovac Pharm.D. 11:20 06/06/17 ([email or web address]) Performed at [facility], [address] [place], [place] [address]    Culture GRAM POSITIVE COCCI  Final   Report Status PENDING  Incomplete  Blood Culture ID Panel (Reflexed)     Status: Abnormal   Collection Time: 06/05/17  5:30 PM  Result Value Ref Range Status   Enterococcus species NOT DETECTED NOT DETECTED Final   Listeria monocytogenes NOT DETECTED NOT DETECTED Final   Staphylococcus species DETECTED (A) NOT DETECTED Final    Comment: CRITICAL RESULT CALLED TO, READ BACK BY AND VERIFIED WITH: N. Glogovac Pharm.D. 11:20 06/06/17 ([email or web address])    Staphylococcus aureus DETECTED (A) NOT DETECTED Final    Comment: Methicillin (oxacillin) susceptible Staphylococcus aureus (MSSA). Preferred therapy is anti staphylococcal beta lactam antibiotic (Cefazolin or Nafcillin), unless clinically contraindicated. CRITICAL RESULT CALLED TO, READ BACK BY AND VERIFIED WITH: N. Glogovac Pharm.D. 11:20 06/06/17 ([email or web address])    Methicillin resistance NOT DETECTED NOT DETECTED Final   Streptococcus species NOT DETECTED NOT DETECTED Final   Streptococcus  agalactiae NOT DETECTED NOT DETECTED Final   Streptococcus pneumoniae NOT DETECTED NOT DETECTED Final   Streptococcus pyogenes NOT DETECTED NOT DETECTED Final   Acinetobacter baumannii NOT DETECTED NOT DETECTED Final   Enterobacteriaceae species NOT DETECTED NOT DETECTED Final   Enterobacter cloacae complex NOT DETECTED NOT DETECTED Final   Escherichia coli NOT DETECTED NOT DETECTED Final   Klebsiella oxytoca NOT DETECTED NOT DETECTED Final   Klebsiella pneumoniae NOT DETECTED NOT DETECTED Final   Proteus species NOT DETECTED NOT DETECTED Final   Serratia marcescens NOT DETECTED NOT DETECTED Final   Haemophilus influenzae NOT DETECTED NOT DETECTED Final   Neisseria meningitidis NOT DETECTED NOT DETECTED Final   Pseudomonas aeruginosa NOT DETECTED NOT DETECTED Final   Candida albicans NOT DETECTED NOT DETECTED Final   Candida glabrata NOT DETECTED NOT DETECTED Final   Candida krusei NOT DETECTED NOT DETECTED Final   Candida parapsilosis NOT DETECTED NOT DETECTED Final   Candida tropicalis NOT DETECTED NOT DETECTED Final    Comment: Performed at [facility], [address] [place], [place] [address]  MRSA PCR Screening     Status: None   Collection Time: 06/05/17  9:15 PM  Result Value Ref Range Status   MRSA by PCR NEGATIVE NEGATIVE Final    Comment:        The GeneXpert MRSA Assay (FDA approved for NASAL specimens only), is one component of a comprehensive MRSA colonization surveillance program. It is not intended to diagnose MRSA infection nor to guide or monitor treatment for MRSA infections.     Acey Lavornelius Van Dam, MD [facility] [phone] pager   [phone] cell 06/06/2017, 2:47 PM

## 2017-06-06 NOTE — Progress Notes (Signed)
[facility] PROGRESS NOTE  David SchlatterRichard [name] ZOX:[id number] DOB: November 14, 1980 DOA: 06/05/2017 PCP: Patient, No Pcp Per  Interim summary and HPI 36 y.o. male with history of IV drug abuse presents to the ER after patient started developing generalized body ache fever and chills.  Patient states that he uses IV drugs heroin twice daily and today started developing body aches a few hours after injecting.  Since it persisted patient came to the ER.  Patient was admitted in May of this year for infection involving the ankle joint which required debridement  Assessment/Plan: 1-sepsis due to MSSA bacteremia -initially on broad IV antibiotics, now base on cultures results narrow to cefazolin IV -ID consulted -2D echo ordered as per ID recommendations and might need TEE as well. -patient lactic acid and fever trending down; WBC"s still elevated. -continue IVF's and supportive care -will watch closely in stepdown  2-IVD use -admitted to use heroin twice a day -last use prior to admission -patient somnolent/lethargic, but protecting airways and able to follow simple commands. -will follow CIWA protocol and use PRN narcan if needed -bedside sitter also ordered for safety precaution   3-hx of Hep C -will check HCV RNA  4-depression with intermittent suicidal though  -no plans or active ideation currently -will ask for psychiatry to assess while inpatient  5-transaminitis -in the presence of sepsis and hep C -LFT's improved with IVF's -will follow trend  -no icterus on exam  6-thrombocytopenia -due to sepsis most likely -will monitor trend  -no signs of active bleeding   Code Status: Full Family Communication: no family at bedside  Disposition Plan: remains inpatient, continue IV antibiotics, follow ID recommendations and clinical response.   Consultants:  ID  Psychiatry   Procedures:  2D echo:  Antibiotics:  Vanc and Zosyn 11/15>>11/16  Cefazolin  11/16  HPI/Subjective: Somnolent, afebrile currently and denying CP or SOB. Patient with overall poor insight.  Objective: Vitals:   06/06/17 0800 06/06/17 1200  BP:    Pulse:    Resp:    Temp: 98.2 F (36.8 C) 98.6 F (37 C)  SpO2:      Intake/Output Summary (Last 24 hours) at 06/06/2017 1649 Last data filed at 06/06/2017 1600 Gross per 24 hour  Intake 5910 ml  Output 2950 ml  Net 2960 ml   Filed Weights   06/05/17 1610 06/06/17 0500  Weight: 79.4 kg (175 lb) 66.1 kg (145 lb 11.6 oz)    Exam:   General: afebrile, no CP, no SOB, no nausea, no vomiting. Somnolent/lethargic, but able to follow simple commands and protecting airways.  Cardiovascular: S1 and S2, no rubs, no gallops  Respiratory: no wheezing, no crackles, no using accessory muscles and with good air movement.  Abdomen: soft, NT, ND, positive BS  Musculoskeletal: no edema, no cyanosis, no clubbing   Data Reviewed: Basic Metabolic Panel: Recent Labs  Lab 06/05/17 1638 06/05/17 2149 06/06/17 0039  NA 135  --  138  K 3.2*  --  3.8  CL 100*  --  112*  CO2 27  --  22  GLUCOSE 107*  --  138*  BUN 10  --  9  CREATININE 0.74 0.99 0.83  CALCIUM 9.4  --  8.3*   Liver Function Tests: Recent Labs  Lab 06/05/17 1638 06/06/17 0039  AST 86* 58*  ALT 70* 51  ALKPHOS 184* 102  BILITOT 1.5* 0.8  PROT 7.0 5.3*  ALBUMIN 3.6 2.8*    Recent Labs  Lab 06/06/17 0656  AMMONIA  33   CBC: Recent Labs  Lab 06/05/17 1638 06/05/17 2149 06/06/17 0039  WBC 8.2 23.9* 26.1*  NEUTROABS 7.9*  --  24.7*  HGB 13.2 11.3* 11.3*  HCT 38.8* 33.3* 32.5*  MCV 88.0 87.4 86.7  PLT 146* 134* 130*    Recent Results (from the past 240 hour(s))  Blood Culture (routine x 2)     Status: None (Preliminary result)   Collection Time: 06/05/17  4:40 PM  Result Value Ref Range Status   Specimen Description BLOOD BLOOD LEFT HAND  Final   Special Requests   Final    BOTTLES DRAWN AEROBIC AND ANAEROBIC Blood Culture  adequate volume   Culture  Setup Time   Final    GRAM POSITIVE COCCI ANAEROBIC BOTTLE ONLY Organism ID to follow CRITICAL VALUE NOTED.  VALUE IS CONSISTENT WITH PREVIOUSLY REPORTED AND CALLED VALUE. Performed at [facility], [address], [place], [place] [address]    Culture GRAM POSITIVE COCCI  Final   Report Status PENDING  Incomplete  Blood Culture ID Panel (Reflexed)     Status: Abnormal   Collection Time: 06/05/17  4:40 PM  Result Value Ref Range Status   Enterococcus species NOT DETECTED NOT DETECTED Final   Listeria monocytogenes NOT DETECTED NOT DETECTED Final   Staphylococcus species DETECTED (A) NOT DETECTED Final    Comment: CRITICAL VALUE NOTED.  VALUE IS CONSISTENT WITH PREVIOUSLY REPORTED AND CALLED VALUE.   Staphylococcus aureus DETECTED (A) NOT DETECTED Final    Comment: Methicillin (oxacillin) susceptible Staphylococcus aureus (MSSA). Preferred therapy is anti staphylococcal beta lactam antibiotic (Cefazolin or Nafcillin), unless clinically contraindicated. CRITICAL VALUE NOTED.  VALUE IS CONSISTENT WITH PREVIOUSLY REPORTED AND CALLED VALUE.    Methicillin resistance NOT DETECTED NOT DETECTED Final   Streptococcus species NOT DETECTED NOT DETECTED Final   Streptococcus agalactiae NOT DETECTED NOT DETECTED Final   Streptococcus pneumoniae NOT DETECTED NOT DETECTED Final   Streptococcus pyogenes NOT DETECTED NOT DETECTED Final   Acinetobacter baumannii NOT DETECTED NOT DETECTED Final   Enterobacteriaceae species NOT DETECTED NOT DETECTED Final   Enterobacter cloacae complex NOT DETECTED NOT DETECTED Final   Escherichia coli NOT DETECTED NOT DETECTED Final   Klebsiella oxytoca NOT DETECTED NOT DETECTED Final   Klebsiella pneumoniae NOT DETECTED NOT DETECTED Final   Proteus species NOT DETECTED NOT DETECTED Final   Serratia marcescens NOT DETECTED NOT DETECTED Final   Haemophilus influenzae NOT DETECTED NOT DETECTED Final   Neisseria meningitidis NOT DETECTED  NOT DETECTED Final   Pseudomonas aeruginosa NOT DETECTED NOT DETECTED Final   Candida albicans NOT DETECTED NOT DETECTED Final   Candida glabrata NOT DETECTED NOT DETECTED Final   Candida krusei NOT DETECTED NOT DETECTED Final   Candida parapsilosis NOT DETECTED NOT DETECTED Final   Candida tropicalis NOT DETECTED NOT DETECTED Final    Comment: Performed at [facility], [address], [place], [place] [address]  Blood Culture (routine x 2)     Status: None (Preliminary result)   Collection Time: 06/05/17  5:30 PM  Result Value Ref Range Status   Specimen Description BLOOD LEFT ANTECUBITAL  Final   Special Requests   Final    IN PEDIATRIC BOTTLE Blood Culture results may not be optimal due to an excessive volume of blood received in culture bottles   Culture  Setup Time   Final    GRAM POSITIVE COCCI IN CLUSTERS IN PEDIATRIC BOTTLE Organism ID to follow CRITICAL RESULT CALLED TO, READ  BACK BY AND VERIFIED WITH: N. Glogovac Pharm.D. 11:20 06/06/17 ([email or web address]) Performed at [facility], [address] [place], [place] [address]    Culture GRAM POSITIVE COCCI  Final   Report Status PENDING  Incomplete  Blood Culture ID Panel (Reflexed)     Status: Abnormal   Collection Time: 06/05/17  5:30 PM  Result Value Ref Range Status   Enterococcus species NOT DETECTED NOT DETECTED Final   Listeria monocytogenes NOT DETECTED NOT DETECTED Final   Staphylococcus species DETECTED (A) NOT DETECTED Final    Comment: CRITICAL RESULT CALLED TO, READ BACK BY AND VERIFIED WITH: N. Glogovac Pharm.D. 11:20 06/06/17 ([email or web address])    Staphylococcus aureus DETECTED (A) NOT DETECTED Final    Comment: Methicillin (oxacillin) susceptible Staphylococcus aureus (MSSA). Preferred therapy is anti staphylococcal beta lactam antibiotic (Cefazolin or Nafcillin), unless clinically contraindicated. CRITICAL RESULT CALLED TO, READ BACK BY AND VERIFIED WITH: N. Glogovac Pharm.D. 11:20 06/06/17 ([email or web address])     Methicillin resistance NOT DETECTED NOT DETECTED Final   Streptococcus species NOT DETECTED NOT DETECTED Final   Streptococcus agalactiae NOT DETECTED NOT DETECTED Final   Streptococcus pneumoniae NOT DETECTED NOT DETECTED Final   Streptococcus pyogenes NOT DETECTED NOT DETECTED Final   Acinetobacter baumannii NOT DETECTED NOT DETECTED Final   Enterobacteriaceae species NOT DETECTED NOT DETECTED Final   Enterobacter cloacae complex NOT DETECTED NOT DETECTED Final   Escherichia coli NOT DETECTED NOT DETECTED Final   Klebsiella oxytoca NOT DETECTED NOT DETECTED Final   Klebsiella pneumoniae NOT DETECTED NOT DETECTED Final   Proteus species NOT DETECTED NOT DETECTED Final   Serratia marcescens NOT DETECTED NOT DETECTED Final   Haemophilus influenzae NOT DETECTED NOT DETECTED Final   Neisseria meningitidis NOT DETECTED NOT DETECTED Final   Pseudomonas aeruginosa NOT DETECTED NOT DETECTED Final   Candida albicans NOT DETECTED NOT DETECTED Final   Candida glabrata NOT DETECTED NOT DETECTED Final   Candida krusei NOT DETECTED NOT DETECTED Final   Candida parapsilosis NOT DETECTED NOT DETECTED Final   Candida tropicalis NOT DETECTED NOT DETECTED Final    Comment: Performed at [facility], [address] [place], [place] [address]  MRSA PCR Screening     Status: None   Collection Time: 06/05/17  9:15 PM  Result Value Ref Range Status   MRSA by PCR NEGATIVE NEGATIVE Final    Comment:        The GeneXpert MRSA Assay (FDA approved for NASAL specimens only), is one component of a comprehensive MRSA colonization surveillance program. It is not intended to diagnose MRSA infection nor to guide or monitor treatment for MRSA infections.      Studies: Dg Chest 2 View  Result Date: 06/05/2017 CLINICAL DATA:  Fever. EXAM: CHEST  2 VIEW COMPARISON:  Radiograph of November 18, 2015. FINDINGS: The heart size and mediastinal contours are within normal limits. Both lungs are clear. No  pneumothorax or pleural effusion is noted. The visualized skeletal structures are unremarkable. IMPRESSION: No active cardiopulmonary disease. Electronically Signed   By: Lupita Raider, M.D.   On: 06/05/2017 17:27   Ct Head Wo Contrast  Result Date: 06/06/2017 CLINICAL DATA:  Chronic heroin use with headache and altered mental status EXAM: CT HEAD WITHOUT CONTRAST TECHNIQUE: Contiguous axial images were obtained from the base of the skull through the vertex without intravenous contrast. COMPARISON:  Report prior study January 18, 2007 available. Images from that study not available. FINDINGS: Brain: The ventricles are normal in size and  configuration. The right lateral ventricle is slightly larger than the left lateral ventricle. Prior studies are not available for direct comparison to confirm that this finding represents an anatomic variant. Statistically, it most likely represents an anatomic variant. There is no appreciable mass, acute hemorrhage, extra-axial fluid collection, or midline shift. There is calcification in a somewhat linear distribution in the superior right temporal lobe. No other calcification noted of this nature elsewhere. Elsewhere gray-white compartments appear normal. No evident acute infarct. Vascular: No hyperdense vessel. No calcification in expected vascular regions evident. Skull: The bony calvarium appears intact. Sinuses/Orbits: There is mucosal thickening in the left maxillary antrum as well as in several ethmoid air cells. Other visualized paranasal sinuses are clear. Orbits appear symmetric bilaterally. Other: The mastoid air cells are clear. IMPRESSION: 1. Calcification in a somewhat linear distribution in the right temporal lobe. This calcification is of uncertain etiology. It may have posttraumatic etiology. This calcification may also represent residua from a vascular type malformation. Potentially MR with intravenous contrast could be helpful for further delineation in this  area. 2. No acute hemorrhage evident. No mass or extra-axial fluid collection. No acute infarct evident. Elsewhere gray-white compartments appear normal. 3.  There are areas of paranasal sinus disease. Electronically Signed   By: Bretta BangWilliam  Woodruff III M.D.   On: 06/06/2017 10:15    Scheduled Meds: . enoxaparin (LOVENOX) injection  40 mg Subcutaneous Q24H  . folic acid  1 mg Oral Daily  . multivitamin with minerals  1 tablet Oral Daily  . thiamine  100 mg Intravenous Daily   Continuous Infusions: .  ceFAZolin (ANCEF) IV      Time spent: 35 minutes    Vassie Lollarlos Adelyna Brockman  [facility] Pager [phone]. If 7PM-7AM, please contact night-coverage at [email or web address], password [email or web address] 06/06/2017, 4:49 PM  LOS: 1 day

## 2017-06-06 NOTE — Progress Notes (Signed)
  Echocardiogram 2D Echocardiogram has been performed.  Nolon RodBrown, Tony 06/06/2017, 3:45 PM

## 2017-06-06 NOTE — Progress Notes (Signed)
Spoke to patient's mother at length over the phone. Mom wanted the patient to have a Psych consult because patient has been battling depression for some time. Patient is homeless and recently discovered that his girlfriend is pregnant. Patient also has 3 children from 3 different girlfriends. Patient has been talking about wanting to die because he is unable to handle his problems. But patient has not made any plans or tried to kill himself. Per Mom, current girlfriend was arrested a few days back (reason not known). Patient is a little restless at present but not agitated. Keeps asking when he can go home but has not been aggressive towards staff. Will continue to monitor.

## 2017-06-07 DIAGNOSIS — Z813 Family history of other psychoactive substance abuse and dependence: Secondary | ICD-10-CM

## 2017-06-07 DIAGNOSIS — B181 Chronic viral hepatitis B without delta-agent: Secondary | ICD-10-CM

## 2017-06-07 DIAGNOSIS — R45851 Suicidal ideations: Secondary | ICD-10-CM

## 2017-06-07 DIAGNOSIS — R6521 Severe sepsis with septic shock: Secondary | ICD-10-CM

## 2017-06-07 DIAGNOSIS — F1210 Cannabis abuse, uncomplicated: Secondary | ICD-10-CM

## 2017-06-07 DIAGNOSIS — B182 Chronic viral hepatitis C: Secondary | ICD-10-CM

## 2017-06-07 DIAGNOSIS — F17210 Nicotine dependence, cigarettes, uncomplicated: Secondary | ICD-10-CM

## 2017-06-07 DIAGNOSIS — F1190 Opioid use, unspecified, uncomplicated: Secondary | ICD-10-CM

## 2017-06-07 LAB — CBC
HEMATOCRIT: 29.3 % — AB (ref 39.0–52.0)
HEMOGLOBIN: 10.3 g/dL — AB (ref 13.0–17.0)
MCH: 30.1 pg (ref 26.0–34.0)
MCHC: 35.2 g/dL (ref 30.0–36.0)
MCV: 85.7 fL (ref 78.0–100.0)
Platelets: 100 10*3/uL — ABNORMAL LOW (ref 150–400)
RBC: 3.42 MIL/uL — ABNORMAL LOW (ref 4.22–5.81)
RDW: 13.2 % (ref 11.5–15.5)
WBC: 12.7 10*3/uL — AB (ref 4.0–10.5)

## 2017-06-07 LAB — SEDIMENTATION RATE: Sed Rate: 23 mm/hr — ABNORMAL HIGH (ref 0–16)

## 2017-06-07 LAB — COMPREHENSIVE METABOLIC PANEL
ALT: 39 U/L (ref 17–63)
AST: 34 U/L (ref 15–41)
Albumin: 3 g/dL — ABNORMAL LOW (ref 3.5–5.0)
Alkaline Phosphatase: 86 U/L (ref 38–126)
Anion gap: 6 (ref 5–15)
BUN: 8 mg/dL (ref 6–20)
CHLORIDE: 107 mmol/L (ref 101–111)
CO2: 23 mmol/L (ref 22–32)
Calcium: 8.7 mg/dL — ABNORMAL LOW (ref 8.9–10.3)
Creatinine, Ser: 0.75 mg/dL (ref 0.61–1.24)
GFR calc Af Amer: >60 mL/min (ref >60)
GLUCOSE: 101 mg/dL — AB (ref 65–99)
Potassium: 3.2 mmol/L — ABNORMAL LOW (ref 3.5–5.1)
Sodium: 136 mmol/L (ref 135–145)
TOTAL PROTEIN: 5.8 g/dL — AB (ref 6.5–8.1)
Total Bilirubin: 0.6 mg/dL (ref 0.3–1.2)

## 2017-06-07 LAB — URINE CULTURE: Culture: NO GROWTH

## 2017-06-07 LAB — HIV ANTIBODY (ROUTINE TESTING W REFLEX): HIV Screen 4th Generation wRfx: NONREACTIVE

## 2017-06-07 LAB — C-REACTIVE PROTEIN: CRP: 8.7 mg/dL — ABNORMAL HIGH (ref <1.0)

## 2017-06-07 MED ORDER — POTASSIUM CHLORIDE CRYS ER 20 MEQ PO TBCR
40.0000 meq | EXTENDED_RELEASE_TABLET | Freq: Four times a day (QID) | ORAL | Status: AC
  Administered 2017-06-07: 40 meq via ORAL
  Filled 2017-06-07: qty 2

## 2017-06-07 MED ORDER — POTASSIUM CHLORIDE CRYS ER 20 MEQ PO TBCR
80.0000 meq | EXTENDED_RELEASE_TABLET | Freq: Once | ORAL | Status: DC
Start: 2017-06-07 — End: 2017-06-07

## 2017-06-07 NOTE — Progress Notes (Signed)
Patient ID: Garth SchlatterRichard Dyal, male   DOB: Jul 31, 1980, 36 y.o.   MRN: [id number]  PROGRESS NOTE    Garth SchlatterRichard Hackleman  WUJ:[id number] DOB: Jul 31, 1980 DOA: 06/05/2017 PCP: Patient, No Pcp Per   Brief Narrative:  36 year old male with history of iv drug abuse presented with generalized body ache, fever and chills. He was found to have MSSA bacteremia. ID was consulted. Psychiatry was consulted for severe depression and intermittent passive suicidal ideation.   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  -Hemodynamically stable - Antibiotics as per ID recommendations  MSSA bacteremia  - TTE negative for vegetations -Continue cefazolin as per ID -Repeat blood cultures pending  Leukocytosis -Improving  Ongoing intravenous drug use -Monitor mental status. -will follow CIWA protocol and use PRN narcan if needed  Severe depression with question of passive suicidal ideation -Psychiatry consulted -Continue bedside sitter  History of hepatitis C -Follow HCV RNA; follow with ID   Transaminitis -in the presence of sepsis and hep C -Resolved  Thrombocytopenia -due to sepsis most likely -will monitor trend  -no signs of active bleeding    DVT prophylaxis: Lovenox Code Status: Full Family Communication: None at bedside Disposition Plan: Depends on clinical outcome  Consultants: ID and psychiatriy  Procedures: 2D echo on 06/06/2017 Study Conclusions  - Left ventricle: The cavity size was normal. Systolic function was   normal. The estimated ejection fraction was in the range of 60%   to 65%. Wall motion was normal; there were no regional wall   motion  abnormalities. Left ventricular diastolic function   parameters were normal.  Impressions:  - There was no evidence of a vegetation.  Recommendations:  Consider transesophageal echocardiography if clinically indicated in order to exclude endocarditis.  Antimicrobials:   Vanc and Zosyn 11/15-11/16 Cefazolin 06/06/17 onwards  Subjective: Patient seen and examined at bedside.  He is very sleepy but wakes up on calling his name and answers some questions.  He is a poor historian.  No overnight fever or vomiting.  Objective: Vitals:   06/07/17 0634 06/07/17 0700 06/07/17 0720 06/07/17 0852  BP:  134/81  117/78  Pulse:    73  Resp:  (!) 32    Temp: 98.4 F (36.9 C)  98.6 F (37 C)   TempSrc: Oral  Oral   SpO2:      Weight:      Height:        Intake/Output Summary (Last 24 hours) at 06/07/2017 1049 Last data filed at 06/07/2017 0500 Gross per 24 hour  Intake 680 ml  Output 4075 ml  Net -3395 ml   Filed Weights   06/05/17 1610 06/06/17 0500  Weight: 79.4 kg (175 lb) 66.1 kg (145 lb 11.6 oz)    Examination:  General exam: Appears sleepy but wakes up on calling his name Respiratory system: Bilateral decreased breath sound at bases Cardiovascular system: S1 & S2 heard, rate controlled.  Gastrointestinal system: Abdomen is nondistended, soft and nontender. Normal bowel sounds heard. Extremities: No cyanosis, clubbing, edema   Data Reviewed: I have personally reviewed following labs and imaging studies  CBC: Recent Labs  Lab 06/05/17 1638 06/05/17 2149 06/06/17 0039 06/07/17 0358  WBC 8.2 23.9* 26.1* 12.7*  NEUTROABS 7.9*  --  24.7*  --   HGB 13.2 11.3* 11.3* 10.3*  HCT 38.8* 33.3* 32.5* 29.3*  MCV 88.0 87.4 86.7 85.7  PLT 146* 134* 130* 100*   Basic Metabolic Panel: Recent Labs  Lab 06/05/17 1638 06/05/17 2149 06/06/17 0039 06/07/17 0358  NA 135  --  138 136  K 3.2*  --  3.8 3.2*  CL 100*  --  112* 107  CO2 27  --  22 23  GLUCOSE 107*  --  138*  101*  BUN 10  --  9 8  CREATININE 0.74 0.99 0.83 0.75  CALCIUM 9.4  --  8.3* 8.7*   GFR: Estimated Creatinine Clearance: 119.3 mL/min (by C-G formula based on SCr of 0.75 mg/dL). Liver Function Tests: Recent Labs  Lab 06/05/17 1638 06/06/17 0039 06/07/17 0358  AST 86* 58* 34  ALT 70* 51 39  ALKPHOS 184* 102 86  BILITOT 1.5* 0.8 0.6  PROT 7.0 5.3* 5.8*  ALBUMIN 3.6 2.8* 3.0*   No results for input(s): LIPASE, AMYLASE in the last 168 hours. Recent Labs  Lab 06/06/17 0656  AMMONIA 33   Coagulation Profile: Recent Labs  Lab 06/05/17 2149  INR 1.43   Cardiac Enzymes: No results for input(s): CKTOTAL, CKMB, CKMBINDEX, TROPONINI in the last 168 hours. BNP (last 3 results) No results for input(s): PROBNP in the last 8760 hours. HbA1C: No results for input(s): HGBA1C in the last 72 hours. CBG: No results for input(s): GLUCAP in the last 168 hours. Lipid Profile: No results for input(s): CHOL, HDL, LDLCALC, TRIG, CHOLHDL, LDLDIRECT in the last 72 hours. Thyroid Function Tests: No results for input(s): TSH, T4TOTAL, FREET4, T3FREE, THYROIDAB in the last 72 hours. Anemia Panel: No results for input(s): VITAMINB12, FOLATE, FERRITIN, TIBC, IRON, RETICCTPCT in the last 72 hours. Sepsis Labs: Recent Labs  Lab 06/05/17 1655 06/05/17 1900 06/05/17 2149 06/06/17 0039  PROCALCITON  --   --  110.54  --   LATICACIDVEN 5.91* 4.26* 3.2* 3.1*    Recent Results (from the past 240 hour(s))  Blood Culture (routine x 2)     Status: Abnormal (Preliminary result)   Collection Time: 06/05/17  4:40 PM  Result Value Ref Range Status   Specimen Description BLOOD BLOOD LEFT HAND  Final   Special Requests   Final    BOTTLES DRAWN AEROBIC AND ANAEROBIC Blood Culture adequate volume   Culture  Setup Time   Final    GRAM POSITIVE COCCI ANAEROBIC BOTTLE ONLY CRITICAL VALUE NOTED.  VALUE IS CONSISTENT WITH PREVIOUSLY REPORTED AND CALLED VALUE.    Culture (A)  Final    STAPHYLOCOCCUS  AUREUS SUSCEPTIBILITIES TO FOLLOW Performed at [facility], [address] [place], [place] [address]    Report Status PENDING  Incomplete  Blood Culture ID Panel (Reflexed)     Status: Abnormal   Collection Time: 06/05/17  4:40 PM  Result Value Ref Range Status   Enterococcus species NOT DETECTED NOT DETECTED Final   Listeria monocytogenes NOT DETECTED NOT DETECTED Final   Staphylococcus species DETECTED (A) NOT DETECTED Final    Comment: CRITICAL VALUE NOTED.  VALUE IS CONSISTENT WITH PREVIOUSLY REPORTED AND CALLED VALUE.   Staphylococcus aureus DETECTED (A) NOT DETECTED Final    Comment: Methicillin (oxacillin) susceptible Staphylococcus aureus (MSSA). Preferred therapy is anti staphylococcal beta lactam antibiotic (Cefazolin or Nafcillin), unless clinically contraindicated. CRITICAL VALUE NOTED.  VALUE IS CONSISTENT WITH PREVIOUSLY REPORTED AND CALLED  VALUE.    Methicillin resistance NOT DETECTED NOT DETECTED Final   Streptococcus species NOT DETECTED NOT DETECTED Final   Streptococcus agalactiae NOT DETECTED NOT DETECTED Final   Streptococcus pneumoniae NOT DETECTED NOT DETECTED Final   Streptococcus pyogenes NOT DETECTED NOT DETECTED Final   Acinetobacter baumannii NOT DETECTED NOT DETECTED Final   Enterobacteriaceae species NOT DETECTED NOT DETECTED Final   Enterobacter cloacae complex NOT DETECTED NOT DETECTED Final   Escherichia coli NOT DETECTED NOT DETECTED Final   Klebsiella oxytoca NOT DETECTED NOT DETECTED Final   Klebsiella pneumoniae NOT DETECTED NOT DETECTED Final   Proteus species NOT DETECTED NOT DETECTED Final   Serratia marcescens NOT DETECTED NOT DETECTED Final   Haemophilus influenzae NOT DETECTED NOT DETECTED Final   Neisseria meningitidis NOT DETECTED NOT DETECTED Final   Pseudomonas aeruginosa NOT DETECTED NOT DETECTED Final   Candida albicans NOT DETECTED NOT DETECTED Final   Candida glabrata NOT DETECTED NOT DETECTED Final   Candida krusei NOT  DETECTED NOT DETECTED Final   Candida parapsilosis NOT DETECTED NOT DETECTED Final   Candida tropicalis NOT DETECTED NOT DETECTED Final    Comment: Performed at [facility], [address], [place], [place] [address]  Blood Culture (routine x 2)     Status: Abnormal (Preliminary result)   Collection Time: 06/05/17  5:30 PM  Result Value Ref Range Status   Specimen Description BLOOD LEFT ANTECUBITAL  Final   Special Requests   Final    IN PEDIATRIC BOTTLE Blood Culture results may not be optimal due to an excessive volume of blood received in culture bottles   Culture  Setup Time   Final    GRAM POSITIVE COCCI IN CLUSTERS IN PEDIATRIC BOTTLE CRITICAL RESULT CALLED TO, READ BACK BY AND VERIFIED WITH: N. Glogovac Pharm.D. 11:20 06/06/17 ([email or web address])    Culture (A)  Final    STAPHYLOCOCCUS AUREUS SUSCEPTIBILITIES TO FOLLOW Performed at [facility], [address], [place], [place] [address]    Report Status PENDING  Incomplete  Blood Culture ID Panel (Reflexed)     Status: Abnormal   Collection Time: 06/05/17  5:30 PM  Result Value Ref Range Status   Enterococcus species NOT DETECTED NOT DETECTED Final   Listeria monocytogenes NOT DETECTED NOT DETECTED Final   Staphylococcus species DETECTED (A) NOT DETECTED Final    Comment: CRITICAL RESULT CALLED TO, READ BACK BY AND VERIFIED WITH: N. Glogovac Pharm.D. 11:20 06/06/17 ([email or web address])    Staphylococcus aureus DETECTED (A) NOT DETECTED Final    Comment: Methicillin (oxacillin) susceptible Staphylococcus aureus (MSSA). Preferred therapy is anti staphylococcal beta lactam antibiotic (Cefazolin or Nafcillin), unless clinically contraindicated. CRITICAL RESULT CALLED TO, READ BACK BY AND VERIFIED WITH: N. Glogovac Pharm.D. 11:20 06/06/17 ([email or web address])    Methicillin resistance NOT DETECTED NOT DETECTED Final   Streptococcus species NOT DETECTED NOT DETECTED Final   Streptococcus agalactiae NOT DETECTED NOT DETECTED Final   Streptococcus  pneumoniae NOT DETECTED NOT DETECTED Final   Streptococcus pyogenes NOT DETECTED NOT DETECTED Final   Acinetobacter baumannii NOT DETECTED NOT DETECTED Final   Enterobacteriaceae species NOT DETECTED NOT DETECTED Final   Enterobacter cloacae complex NOT DETECTED NOT DETECTED Final   Escherichia coli NOT DETECTED NOT DETECTED Final   Klebsiella oxytoca NOT DETECTED NOT DETECTED Final   Klebsiella pneumoniae NOT DETECTED NOT DETECTED Final   Proteus species NOT DETECTED NOT DETECTED Final   Serratia marcescens NOT DETECTED NOT DETECTED Final   Haemophilus influenzae NOT DETECTED NOT  DETECTED Final   Neisseria meningitidis NOT DETECTED NOT DETECTED Final   Pseudomonas aeruginosa NOT DETECTED NOT DETECTED Final   Candida albicans NOT DETECTED NOT DETECTED Final   Candida glabrata NOT DETECTED NOT DETECTED Final   Candida krusei NOT DETECTED NOT DETECTED Final   Candida parapsilosis NOT DETECTED NOT DETECTED Final   Candida tropicalis NOT DETECTED NOT DETECTED Final    Comment: Performed at [facility], [address], [place], [place] [address]  Urine culture     Status: None   Collection Time: 06/05/17  5:49 PM  Result Value Ref Range Status   Specimen Description URINE, CLEAN CATCH  Final   Special Requests NONE  Final   Culture   Final    NO GROWTH Performed at [facility], [address] [place], [place] [address]    Report Status 06/07/2017 FINAL  Final  MRSA PCR Screening     Status: None   Collection Time: 06/05/17  9:15 PM  Result Value Ref Range Status   MRSA by PCR NEGATIVE NEGATIVE Final    Comment:        The GeneXpert MRSA Assay (FDA approved for NASAL specimens only), is one component of a comprehensive MRSA colonization surveillance program. It is not intended to diagnose MRSA infection nor to guide or monitor treatment for MRSA infections.          Radiology Studies: Dg Chest 2 View  Result Date: 06/05/2017 CLINICAL DATA:  Fever. EXAM:  CHEST  2 VIEW COMPARISON:  Radiograph of November 18, 2015. FINDINGS: The heart size and mediastinal contours are within normal limits. Both lungs are clear. No pneumothorax or pleural effusion is noted. The visualized skeletal structures are unremarkable. IMPRESSION: No active cardiopulmonary disease. Electronically Signed   By: Lupita Raider, M.D.   On: 06/05/2017 17:27   Ct Head Wo Contrast  Result Date: 06/06/2017 CLINICAL DATA:  Chronic heroin use with headache and altered mental status EXAM: CT HEAD WITHOUT CONTRAST TECHNIQUE: Contiguous axial images were obtained from the base of the skull through the vertex without intravenous contrast. COMPARISON:  Report prior study January 18, 2007 available. Images from that study not available. FINDINGS: Brain: The ventricles are normal in size and configuration. The right lateral ventricle is slightly larger than the left lateral ventricle. Prior studies are not available for direct comparison to confirm that this finding represents an anatomic variant. Statistically, it most likely represents an anatomic variant. There is no appreciable mass, acute hemorrhage, extra-axial fluid collection, or midline shift. There is calcification in a somewhat linear distribution in the superior right temporal lobe. No other calcification noted of this nature elsewhere. Elsewhere gray-white compartments appear normal. No evident acute infarct. Vascular: No hyperdense vessel. No calcification in expected vascular regions evident. Skull: The bony calvarium appears intact. Sinuses/Orbits: There is mucosal thickening in the left maxillary antrum as well as in several ethmoid air cells. Other visualized paranasal sinuses are clear. Orbits appear symmetric bilaterally. Other: The mastoid air cells are clear. IMPRESSION: 1. Calcification in a somewhat linear distribution in the right temporal lobe. This calcification is of uncertain etiology. It may have posttraumatic etiology. This  calcification may also represent residua from a vascular type malformation. Potentially MR with intravenous contrast could be helpful for further delineation in this area. 2. No acute hemorrhage evident. No mass or extra-axial fluid collection. No acute infarct evident. Elsewhere gray-white compartments appear normal. 3.  There are areas of paranasal sinus disease.  Electronically Signed   By: Bretta BangWilliam  Woodruff III M.D.   On: 06/06/2017 10:15        Scheduled Meds: . enoxaparin (LOVENOX) injection  40 mg Subcutaneous Q24H  . folic acid  1 mg Oral Daily  . multivitamin with minerals  1 tablet Oral Daily  . thiamine  100 mg Intravenous Daily   Continuous Infusions: .  ceFAZolin (ANCEF) IV 2 g (06/07/17 0919)     LOS: 2 days        Glade LloydKshitiz Marcelene Weidemann, MD [facility] Pager [phone]  If 7PM-7AM, please contact night-coverage [email or web address] Password [email or web address] 06/07/2017, 10:49 AM

## 2017-06-07 NOTE — Consult Note (Signed)
[facility] Consult   Reason for Consult: depression, passive suicidal thoughts  Referring Physician:  Dr. Starla Link Patient Identification: David [name] MRN:  [id number] Principal Diagnosis: Severe sepsis with septic shock [facility]) Diagnosis:   Patient Active Problem List   Diagnosis Date Noted  . Passive suicidal ideations [R45.851]   . Chronic viral hepatitis B without delta agent and without coma (HCC) [B18.1]   . Chronic hepatitis C without hepatic coma ([place]) [B18.2]   . Staphylococcus aureus bacteremia with sepsis ([place]) [A41.01]   . Severe sepsis with septic shock ([place]) [A41.9, R65.21] 06/05/2017  . Pain and swelling of right ankle [M25.571, M25.471]   . Tobacco abuse [Z72.0]   . Pyogenic arthritis of ankle ([place]) [M00.9]   . Polysubstance abuse ([place]) [F19.10] 12/06/2016  . Synovitis of right ankle [M65.9] 12/06/2016  . Heroin abuse ([place]) [F11.10] 09/07/2013    Total Time spent with patient: 45 minutes  Subjective:   David [name] is a 36 y.o. male patient admitted with generalized body ache and severe sepsis.  HPI:  Patient who reports Long history of Heroin use disorder, amphetamine abuse, Benzodiazepine abuse, hepatitis C who presented to the hospital due to  generalized myalgias. He was admitted to the medical floor due to severe sepsis. Patient reports that he uses heroin at least twice daily, last use was 3 days ago. He abuse other drugs but his drug of choice is Heroin. He reports history of detoxification and rehabilitation. Patient reports occasional mood swings, agitation and depression due to his inability to stop using drugs. Today, he denies depression, suicidal thoughts, psychosis or delusional thinking.  Past Psychiatric History: Opioid use disorder  Risk to Self: Is patient at risk for suicide?: No Risk to Others:   Prior Inpatient Therapy:   Prior Outpatient Therapy:    Past Medical History:  Past Medical History:  Diagnosis Date  .  Dialysis care    no longer in dialysis  . Hepatitis C antibody positive in blood   . Heroin abuse ([place])   . Kidney stones   . Polysubstance abuse [facility])     Past Surgical History:  Procedure Laterality Date  . IRRIGATION AND DEBRIDEMENT ANKLE Right 12/07/2016   Performed by Alt[place], MD at [facility]  . IRRIGATION AND DEBRIDEMENT EXTREMITY Left 09/07/2013   Performed by Roseanne Kaufman, MD at [facility] OR   Family History:  Family History  Problem Relation Age of Onset  . Diabetes type II Mother   . Drug abuse Father    Family Psychiatric  History:  Social History:  Social History   Substance and Sexual Activity  Alcohol Use Yes   Comment: denies 07/07/16     Social History   Substance and Sexual Activity  Drug Use Yes  . Types: Marijuana   Comment: Meth a few days ago.    Social History   Socioeconomic History  . Marital status: Single    Spouse name: None  . Number of children: None  . Years of education: None  . Highest education level: None  Social Needs  . Financial resource strain: None  . Food insecurity - worry: None  . Food insecurity - inability: None  . Transportation needs - medical: None  . Transportation needs - non-medical: None  Occupational History  . None  Tobacco Use  . Smoking status: Current Every Day Smoker    Packs/day: 1.00    Types: Cigarettes  . Smokeless tobacco: Never Used  Substance and Sexual Activity  .  Alcohol use: Yes    Comment: denies 07/07/16  . Drug use: Yes    Types: Marijuana    Comment: Meth a few days ago.  Marland Kitchen Sexual activity: None  Other Topics Concern  . None  Social History Narrative  . None   Additional Social History:    Allergies:  No Known Allergies  Labs:  Results for orders placed or performed during the hospital encounter of 06/05/17 (from the past 48 hour(s))  Comprehensive metabolic panel     Status: Abnormal   Collection Time: 06/05/17  4:38 PM  Result Value Ref Range   Sodium 135 135 - 145  mmol/L   Potassium 3.2 (L) 3.5 - 5.1 mmol/L   Chloride 100 (L) 101 - 111 mmol/L   CO2 27 22 - 32 mmol/L   Glucose, Bld 107 (H) 65 - 99 mg/dL   BUN 10 6 - 20 mg/dL   Creatinine, Ser 0.74 0.61 - 1.24 mg/dL   Calcium 9.4 8.9 - 10.3 mg/dL   Total Protein 7.0 6.5 - 8.1 g/dL   Albumin 3.6 3.5 - 5.0 g/dL   AST 86 (H) 15 - 41 U/L   ALT 70 (H) 17 - 63 U/L   Alkaline Phosphatase 184 (H) 38 - 126 U/L   Total Bilirubin 1.5 (H) 0.3 - 1.2 mg/dL   GFR calc non Af Amer >60 >60 mL/min   GFR calc Af Amer >60 >60 mL/min    Comment: (NOTE) The eGFR has been calculated using the CKD EPI equation. This calculation has not been validated in all clinical situations. eGFR's persistently <60 mL/min signify possible Chronic Kidney Disease.    Anion gap 8 5 - 15  CBC WITH DIFFERENTIAL     Status: Abnormal   Collection Time: 06/05/17  4:38 PM  Result Value Ref Range   WBC 8.2 4.0 - 10.5 K/uL   RBC 4.41 4.22 - 5.81 MIL/uL   Hemoglobin 13.2 13.0 - 17.0 g/dL   HCT 38.8 (L) 39.0 - 52.0 %   MCV 88.0 78.0 - 100.0 fL   MCH 29.9 26.0 - 34.0 pg   MCHC 34.0 30.0 - 36.0 g/dL   RDW 13.0 11.5 - 15.5 %   Platelets 146 (L) 150 - 400 K/uL   Neutrophils Relative % 96 %   Neutro Abs 7.9 (H) 1.7 - 7.7 K/uL   Lymphocytes Relative 3 %   Lymphs Abs 0.2 (L) 0.7 - 4.0 K/uL   Monocytes Relative 1 %   Monocytes Absolute 0.0 (L) 0.1 - 1.0 K/uL   Eosinophils Relative 0 %   Eosinophils Absolute 0.0 0.0 - 0.7 K/uL   Basophils Relative 0 %   Basophils Absolute 0.0 0.0 - 0.1 K/uL  Blood Culture (routine x 2)     Status: Abnormal (Preliminary result)   Collection Time: 06/05/17  4:40 PM  Result Value Ref Range   Specimen Description BLOOD BLOOD LEFT HAND    Special Requests      BOTTLES DRAWN AEROBIC AND ANAEROBIC Blood Culture adequate volume   Culture  Setup Time      GRAM POSITIVE COCCI ANAEROBIC BOTTLE ONLY CRITICAL VALUE NOTED.  VALUE IS CONSISTENT WITH PREVIOUSLY REPORTED AND CALLED VALUE.    Culture (A)      STAPHYLOCOCCUS AUREUS SUSCEPTIBILITIES TO FOLLOW Performed at [facility], [place] [address], [place], [place] [address]    Report Status PENDING   Blood Culture ID Panel (Reflexed)     Status: Abnormal   Collection  Time: 06/05/17  4:40 PM  Result Value Ref Range   Enterococcus species NOT DETECTED NOT DETECTED   Listeria monocytogenes NOT DETECTED NOT DETECTED   Staphylococcus species DETECTED (A) NOT DETECTED    Comment: CRITICAL VALUE NOTED.  VALUE IS CONSISTENT WITH PREVIOUSLY REPORTED AND CALLED VALUE.   Staphylococcus aureus DETECTED (A) NOT DETECTED    Comment: Methicillin (oxacillin) susceptible Staphylococcus aureus (MSSA). Preferred therapy is anti staphylococcal beta lactam antibiotic (Cefazolin or Nafcillin), unless clinically contraindicated. CRITICAL VALUE NOTED.  VALUE IS CONSISTENT WITH PREVIOUSLY REPORTED AND CALLED VALUE.    Methicillin resistance NOT DETECTED NOT DETECTED   Streptococcus species NOT DETECTED NOT DETECTED   Streptococcus agalactiae NOT DETECTED NOT DETECTED   Streptococcus pneumoniae NOT DETECTED NOT DETECTED   Streptococcus pyogenes NOT DETECTED NOT DETECTED   Acinetobacter baumannii NOT DETECTED NOT DETECTED   Enterobacteriaceae species NOT DETECTED NOT DETECTED   Enterobacter cloacae complex NOT DETECTED NOT DETECTED   Escherichia coli NOT DETECTED NOT DETECTED   Klebsiella oxytoca NOT DETECTED NOT DETECTED   Klebsiella pneumoniae NOT DETECTED NOT DETECTED   Proteus species NOT DETECTED NOT DETECTED   Serratia marcescens NOT DETECTED NOT DETECTED   Haemophilus influenzae NOT DETECTED NOT DETECTED   Neisseria meningitidis NOT DETECTED NOT DETECTED   Pseudomonas aeruginosa NOT DETECTED NOT DETECTED   Candida albicans NOT DETECTED NOT DETECTED   Candida glabrata NOT DETECTED NOT DETECTED   Candida krusei NOT DETECTED NOT DETECTED   Candida parapsilosis NOT DETECTED NOT DETECTED   Candida tropicalis NOT DETECTED NOT DETECTED    Comment:  Performed at [facility] [address], [place], [place] [address]  I-Stat CG4 Lactic Acid, ED  (not at  [facility])     Status: Abnormal   Collection Time: 06/05/17  4:55 PM  Result Value Ref Range   Lactic Acid, Venous 5.91 (HH) 0.5 - 1.9 mmol/L   Comment NOTIFIED PHYSICIAN   Blood Culture (routine x 2)     Status: Abnormal (Preliminary result)   Collection Time: 06/05/17  5:30 PM  Result Value Ref Range   Specimen Description BLOOD LEFT ANTECUBITAL    Special Requests      IN PEDIATRIC BOTTLE Blood Culture results may not be optimal due to an excessive volume of blood received in culture bottles   Culture  Setup Time      GRAM POSITIVE COCCI IN CLUSTERS IN PEDIATRIC BOTTLE CRITICAL RESULT CALLED TO, READ BACK BY AND VERIFIED WITH: N. Glogovac Pharm.D. 11:20 06/06/17 ([email or web address])    Culture (A)     STAPHYLOCOCCUS AUREUS SUSCEPTIBILITIES TO FOLLOW Performed at [facility] [address] [place], [place] [address]    Report Status PENDING   Blood Culture ID Panel (Reflexed)     Status: Abnormal   Collection Time: 06/05/17  5:30 PM  Result Value Ref Range   Enterococcus species NOT DETECTED NOT DETECTED   Listeria monocytogenes NOT DETECTED NOT DETECTED   Staphylococcus species DETECTED (A) NOT DETECTED    Comment: CRITICAL RESULT CALLED TO, READ BACK BY AND VERIFIED WITH: N. Glogovac Pharm.D. 11:20 06/06/17 (wilsonm)    Staphylococcus aureus DETECTED (A) NOT DETECTED    Comment: Methicillin (oxacillin) susceptible Staphylococcus aureus (MSSA). Preferred therapy is anti staphylococcal beta lactam antibiotic (Cefazolin or Nafcillin), unless clinically contraindicated. CRITICAL RESULT CALLED TO, READ BACK BY AND VERIFIED WITH: N. Glogovac Pharm.D. 11:20 06/06/17 (wilsonm)    Methicillin resistance NOT DETECTED NOT DETECTED   Streptococcus species NOT DETECTED NOT DETECTED  Streptococcus agalactiae NOT DETECTED NOT DETECTED   Streptococcus pneumoniae NOT DETECTED NOT  DETECTED   Streptococcus pyogenes NOT DETECTED NOT DETECTED   Acinetobacter baumannii NOT DETECTED NOT DETECTED   Enterobacteriaceae species NOT DETECTED NOT DETECTED   Enterobacter cloacae complex NOT DETECTED NOT DETECTED   Escherichia coli NOT DETECTED NOT DETECTED   Klebsiella oxytoca NOT DETECTED NOT DETECTED   Klebsiella pneumoniae NOT DETECTED NOT DETECTED   Proteus species NOT DETECTED NOT DETECTED   Serratia marcescens NOT DETECTED NOT DETECTED   Haemophilus influenzae NOT DETECTED NOT DETECTED   Neisseria meningitidis NOT DETECTED NOT DETECTED   Pseudomonas aeruginosa NOT DETECTED NOT DETECTED   Candida albicans NOT DETECTED NOT DETECTED   Candida glabrata NOT DETECTED NOT DETECTED   Candida krusei NOT DETECTED NOT DETECTED   Candida parapsilosis NOT DETECTED NOT DETECTED   Candida tropicalis NOT DETECTED NOT DETECTED    Comment: Performed at [facility], [place] [address] [place], [place] [address]  Urinalysis, Routine w reflex microscopic     Status: Abnormal   Collection Time: 06/05/17  5:49 PM  Result Value Ref Range   Color, Urine YELLOW YELLOW   APPearance CLEAR CLEAR   Specific Gravity, Urine <1.005 (L) 1.005 - 1.030   pH 6.0 5.0 - 8.0   Glucose, UA NEGATIVE NEGATIVE mg/dL   Hgb urine dipstick NEGATIVE NEGATIVE   Bilirubin Urine NEGATIVE NEGATIVE   Ketones, ur NEGATIVE NEGATIVE mg/dL   Protein, ur NEGATIVE NEGATIVE mg/dL   Nitrite NEGATIVE NEGATIVE   Leukocytes, UA NEGATIVE NEGATIVE    Comment: Microscopic not done on urines with negative protein, blood, leukocytes, nitrite, or glucose < 500 mg/dL.  Urine culture     Status: None   Collection Time: 06/05/17  5:49 PM  Result Value Ref Range   Specimen Description URINE, CLEAN CATCH    Special Requests NONE    Culture      NO GROWTH Performed at [facility], [place] [address] [place], [place] [address]    Report Status 06/07/2017 FINAL   I-Stat CG4 Lactic Acid, ED  (not at  [facility])     Status:  Abnormal   Collection Time: 06/05/17  7:00 PM  Result Value Ref Range   Lactic Acid, Venous 4.26 (HH) 0.5 - 1.9 mmol/L   Comment NOTIFIED PHYSICIAN   MRSA PCR Screening     Status: None   Collection Time: 06/05/17  9:15 PM  Result Value Ref Range   MRSA by PCR NEGATIVE NEGATIVE    Comment:        The GeneXpert MRSA Assay (FDA approved for NASAL specimens only), is one component of a comprehensive MRSA colonization surveillance program. It is not intended to diagnose MRSA infection nor to guide or monitor treatment for MRSA infections.   Lactic acid, plasma     Status: Abnormal   Collection Time: 06/05/17  9:49 PM  Result Value Ref Range   Lactic Acid, Venous 3.2 (HH) 0.5 - 1.9 mmol/L    Comment: CRITICAL RESULT CALLED TO, READ BACK BY AND VERIFIED WITH: SAWYER,A RN 11.15.18 _0  ZANDO,C   Procalcitonin     Status: None   Collection Time: 06/05/17  9:49 PM  Result Value Ref Range   Procalcitonin 110.54 ng/mL    Comment:        Interpretation: PCT >= 10 ng/mL: Important systemic inflammatory response, almost exclusively due to severe bacterial sepsis or septic shock. (NOTE)  ICU PCT Algorithm               Non ICU PCT Algorithm    ----------------------------     ------------------------------         PCT < 0.25 ng/mL                 PCT < 0.1 ng/mL     Stopping of antibiotics            Stopping of antibiotics       strongly encouraged.               strongly encouraged.    ----------------------------     ------------------------------       PCT level decrease by               PCT < 0.25 ng/mL       >= 80% from peak PCT       OR PCT 0.25 - 0.5 ng/mL          Stopping of antibiotics                                             encouraged.     Stopping of antibiotics           encouraged.    ----------------------------     ------------------------------       PCT level decrease by              PCT >= 0.25 ng/mL       < 80% from peak PCT        AND PCT >= 0.5  ng/mL             Continuing antibiotics                                              encouraged.       Continuing antibiotics            encouraged.    ----------------------------     ------------------------------     PCT level increase compared          PCT > 0.5 ng/mL         with peak PCT AND          PCT >= 0.5 ng/mL             Escalation of antibiotics                                          strongly encouraged.      Escalation of antibiotics        strongly encouraged.   Protime-INR     Status: Abnormal   Collection Time: 06/05/17  9:49 PM  Result Value Ref Range   Prothrombin Time 17.3 (H) 11.4 - 15.2 seconds   INR 1.43   APTT     Status: Abnormal   Collection Time: 06/05/17  9:49 PM  Result Value Ref Range   aPTT 38 (H) 24 - 36 seconds    Comment:        IF BASELINE aPTT IS ELEVATED, SUGGEST PATIENT RISK ASSESSMENT BE  USED TO DETERMINE APPROPRIATE ANTICOAGULANT THERAPY.   CBC     Status: Abnormal   Collection Time: 06/05/17  9:49 PM  Result Value Ref Range   WBC 23.9 (H) 4.0 - 10.5 K/uL   RBC 3.81 (L) 4.22 - 5.81 MIL/uL   Hemoglobin 11.3 (L) 13.0 - 17.0 g/dL   HCT 33.3 (L) 39.0 - 52.0 %   MCV 87.4 78.0 - 100.0 fL   MCH 29.7 26.0 - 34.0 pg   MCHC 33.9 30.0 - 36.0 g/dL   RDW 13.1 11.5 - 15.5 %   Platelets 134 (L) 150 - 400 K/uL  Creatinine, serum     Status: None   Collection Time: 06/05/17  9:49 PM  Result Value Ref Range   Creatinine, Ser 0.99 0.61 - 1.24 mg/dL   GFR calc non Af Amer >60 >60 mL/min   GFR calc Af Amer >60 >60 mL/min    Comment: (NOTE) The eGFR has been calculated using the CKD EPI equation. This calculation has not been validated in all clinical situations. eGFR's persistently <60 mL/min signify possible Chronic Kidney Disease.   CBC with Differential     Status: Abnormal   Collection Time: 06/06/17 12:39 AM  Result Value Ref Range   WBC 26.1 (H) 4.0 - 10.5 K/uL   RBC 3.75 (L) 4.22 - 5.81 MIL/uL   Hemoglobin 11.3 (L) 13.0 - 17.0  g/dL   HCT 32.5 (L) 39.0 - 52.0 %   MCV 86.7 78.0 - 100.0 fL   MCH 30.1 26.0 - 34.0 pg   MCHC 34.8 30.0 - 36.0 g/dL   RDW 13.2 11.5 - 15.5 %   Platelets 130 (L) 150 - 400 K/uL    Comment: REPEATED TO VERIFY SPECIMEN CHECKED FOR CLOTS    Neutrophils Relative % 92 %   Neutro Abs 24.7 (H) 1.7 - 7.7 K/uL   Lymphocytes Relative 2 %   Lymphs Abs 0.6 (L) 0.7 - 4.0 K/uL   Monocytes Relative 6 %   Monocytes Absolute 1.6 (H) 0.1 - 1.0 K/uL   Eosinophils Relative 0 %   Eosinophils Absolute 0.0 0.0 - 0.7 K/uL   Basophils Relative 0 %   Basophils Absolute 0.0 0.0 - 0.1 K/uL   WBC Morphology INCREASED BANDS (>20% BANDS)     Comment: MILD LEFT SHIFT (1-5% METAS, OCC MYELO, OCC BANDS) TOXIC GRANULATION DOHLE BODIES   Comprehensive metabolic panel     Status: Abnormal   Collection Time: 06/06/17 12:39 AM  Result Value Ref Range   Sodium 138 135 - 145 mmol/L   Potassium 3.8 3.5 - 5.1 mmol/L   Chloride 112 (H) 101 - 111 mmol/L   CO2 22 22 - 32 mmol/L   Glucose, Bld 138 (H) 65 - 99 mg/dL   BUN 9 6 - 20 mg/dL   Creatinine, Ser 0.83 0.61 - 1.24 mg/dL   Calcium 8.3 (L) 8.9 - 10.3 mg/dL   Total Protein 5.3 (L) 6.5 - 8.1 g/dL   Albumin 2.8 (L) 3.5 - 5.0 g/dL   AST 58 (H) 15 - 41 U/L   ALT 51 17 - 63 U/L   Alkaline Phosphatase 102 38 - 126 U/L   Total Bilirubin 0.8 0.3 - 1.2 mg/dL   GFR calc non Af Amer >60 >60 mL/min   GFR calc Af Amer >60 >60 mL/min    Comment: (NOTE) The eGFR has been calculated using the CKD EPI equation. This calculation has not been validated in all clinical situations. eGFR's persistently <60  mL/min signify possible Chronic Kidney Disease.    Anion gap 4 (L) 5 - 15  Lactic acid, plasma     Status: Abnormal   Collection Time: 06/06/17 12:39 AM  Result Value Ref Range   Lactic Acid, Venous 3.1 (HH) 0.5 - 1.9 mmol/L    Comment: CRITICAL RESULT CALLED TO, READ BACK BY AND VERIFIED WITH: SAWYER,A RN 11.16.18 _0  ZANDO,C   Ammonia     Status: None   Collection  Time: 06/06/17  6:56 AM  Result Value Ref Range   Ammonia 33 9 - 35 umol/L  Urine rapid drug screen (hosp performed)     Status: Abnormal   Collection Time: 06/06/17  9:00 PM  Result Value Ref Range   Opiates NONE DETECTED NONE DETECTED   Cocaine NONE DETECTED NONE DETECTED   Benzodiazepines POSITIVE (A) NONE DETECTED   Amphetamines POSITIVE (A) NONE DETECTED   Tetrahydrocannabinol NONE DETECTED NONE DETECTED   Barbiturates NONE DETECTED NONE DETECTED    Comment:        DRUG SCREEN FOR MEDICAL PURPOSES ONLY.  IF CONFIRMATION IS NEEDED FOR ANY PURPOSE, NOTIFY LAB WITHIN 5 DAYS.        LOWEST DETECTABLE LIMITS FOR URINE DRUG SCREEN Drug Class       Cutoff (ng/mL) Amphetamine      1000 Barbiturate      200 Benzodiazepine   130 Tricyclics       865 Opiates          300 Cocaine          300 THC              50   Sedimentation rate     Status: Abnormal   Collection Time: 06/07/17  3:58 AM  Result Value Ref Range   Sed Rate 23 (H) 0 - 16 mm/hr  C-reactive protein     Status: Abnormal   Collection Time: 06/07/17  3:58 AM  Result Value Ref Range   CRP 8.7 (H) <1.0 mg/dL    Comment: Performed at [facility], [place] [address], [place], [place] [address]  Comprehensive metabolic panel     Status: Abnormal   Collection Time: 06/07/17  3:58 AM  Result Value Ref Range   Sodium 136 135 - 145 mmol/L   Potassium 3.2 (L) 3.5 - 5.1 mmol/L   Chloride 107 101 - 111 mmol/L   CO2 23 22 - 32 mmol/L   Glucose, Bld 101 (H) 65 - 99 mg/dL   BUN 8 6 - 20 mg/dL   Creatinine, Ser 0.75 0.61 - 1.24 mg/dL   Calcium 8.7 (L) 8.9 - 10.3 mg/dL   Total Protein 5.8 (L) 6.5 - 8.1 g/dL   Albumin 3.0 (L) 3.5 - 5.0 g/dL   AST 34 15 - 41 U/L   ALT 39 17 - 63 U/L   Alkaline Phosphatase 86 38 - 126 U/L   Total Bilirubin 0.6 0.3 - 1.2 mg/dL   GFR calc non Af Amer >60 >60 mL/min   GFR calc Af Amer >60 >60 mL/min    Comment: (NOTE) The eGFR has been calculated using the CKD EPI equation. This  calculation has not been validated in all clinical situations. eGFR's persistently <60 mL/min signify possible Chronic Kidney Disease.    Anion gap 6 5 - 15  CBC     Status: Abnormal   Collection Time: 06/07/17  3:58 AM  Result Value Ref Range   WBC 12.7 (H) 4.0 - 10.5  K/uL   RBC 3.42 (L) 4.22 - 5.81 MIL/uL   Hemoglobin 10.3 (L) 13.0 - 17.0 g/dL   HCT 29.3 (L) 39.0 - 52.0 %   MCV 85.7 78.0 - 100.0 fL   MCH 30.1 26.0 - 34.0 pg   MCHC 35.2 30.0 - 36.0 g/dL   RDW 13.2 11.5 - 15.5 %   Platelets 100 (L) 150 - 400 K/uL    Comment: SPECIMEN CHECKED FOR CLOTS PLATELET COUNT CONFIRMED BY SMEAR     Current Facility-Administered Medications  Medication Dose Route Frequency Provider Last Rate Last Dose  . acetaminophen (TYLENOL) tablet 650 mg  650 mg Oral Q6H PRN Rise Patience, MD   650 mg at 06/07/17 0023   Or  . acetaminophen (TYLENOL) suppository 650 mg  650 mg Rectal Q6H PRN Rise Patience, MD      . ceFAZolin (ANCEF) IVPB 2g/100 mL premix  2 g Intravenous Q8H Barton Dubois, MD   Stopped at 06/07/17 [phone]  . enoxaparin (LOVENOX) injection 40 mg  40 mg Subcutaneous Q24H Rise Patience, MD   40 mg at 06/06/17 2255  . folic acid (FOLVITE) tablet 1 mg  1 mg Oral Daily Rise Patience, MD   1 mg at 06/07/17 0914  . LORazepam (ATIVAN) injection 2-3 mg  2-3 mg Intravenous Q1H PRN Rise Patience, MD   2 mg at 06/07/17 0309  . multivitamin with minerals tablet 1 tablet  1 tablet Oral Daily Rise Patience, MD   1 tablet at 06/07/17 0914  . naloxone [facility]) injection 0.4 mg  0.4 mg Intravenous PRN Barton Dubois, MD      . ondansetron [facility]) tablet 4 mg  4 mg Oral Q6H PRN Rise Patience, MD       Or  . ondansetron [facility]) injection 4 mg  4 mg Intravenous Q6H PRN Rise Patience, MD   4 mg at 06/07/17 0906  . potassium chloride SA (K-DUR,KLOR-CON) CR tablet 40 mEq  40 mEq Oral Q6H Alekh, Kshitiz, MD      . thiamine (B-1) injection 100 mg  100 mg  Intravenous Daily Rise Patience, MD   100 mg at 06/07/17 4259    Musculoskeletal: Strength & Muscle Tone: within normal limits Gait & Station: normal Patient leans: N/A  Psychiatric Specialty Exam: Physical Exam  Psychiatric: He has a normal mood and affect. His speech is normal and behavior is normal. Judgment and thought content normal. Cognition and memory are normal.    Review of Systems  Constitutional: Positive for malaise/fatigue.  HENT: Negative.   Eyes: Negative.   Respiratory: Negative.   Cardiovascular: Negative.   Gastrointestinal: Negative.   Genitourinary: Negative.   Musculoskeletal: Positive for myalgias.  Skin: Negative.   Neurological: Negative.   Endo/Heme/Allergies: Negative.   Psychiatric/Behavioral: Positive for substance abuse.    Blood pressure 117/78, pulse 73, temperature 98.6 F (37 C), temperature source Oral, resp. rate (!) 32, height 5' 10" (1.778 m), weight 66.1 kg (145 lb 11.6 oz), SpO2 92 %.Body mass index is 20.91 kg/m.  General Appearance: Casual  Eye Contact:  Good  Speech:  Clear and Coherent  Volume:  Normal  Mood:  Euphoric  Affect:  Appropriate  Thought Process:  Coherent and Goal Directed  Orientation:  Full (Time, Place, and Person)  Thought Content:  Logical  Suicidal Thoughts:  No  Homicidal Thoughts:  No  Memory:  Immediate;   Fair Recent;   Fair Remote;   Fair  Judgement:  Fair  Insight:  Fair  Psychomotor Activity:  Normal  Concentration:  Concentration: Fair and Attention Span: Fair  Recall:  Good  Fund of Knowledge:  Fair  Language:  Fair  Akathisia:  No  Handed:  Right  AIMS (if indicated):     Assets:  Communication Skills Desire for Improvement  ADL's:  Intact  Cognition:  WNL  Sleep:   fair     Treatment Plan Summary: Patient with history of Opioid use disorder who was admitted due to myalgia follow Heroin use. Patient appear stable today, he denies SI/HI, Depression, psychosis or delusions.    Diagnosis: Opioid use disorder-moderate to severe.  PLAN:  Patient is cleared by psychiatric service, currently he is not a danger to self or others.  Disposition: No evidence of imminent risk to self or others at present.   Supportive therapy provided about ongoing stressors. Social worker to refer patient to ADS(Alcohol, drug service) when patient is medically cleared.  Corena Pilgrim, MD 06/07/2017 11:52 AM

## 2017-06-07 NOTE — Progress Notes (Signed)
Subjective:  He tells me that he still would like to leave the hospital but he is less nervous and he was yesterday.  He did endorse some passive suicidal ideation that was discovered by nursing staff yesterday.   Antibiotics:  Anti-infectives (From admission, onward)   Start     Dose/Rate Route Frequency Ordered Stop   06/06/17 1600  ceFAZolin (ANCEF) IVPB 2g/100 mL premix     2 g 200 mL/hr over 30 Minutes Intravenous Every 8 hours 06/06/17 1201     06/06/17 0400  vancomycin (VANCOCIN) 1,250 mg in sodium chloride 0.9 % 250 mL IVPB  Status:  Discontinued     1,250 mg 166.7 mL/hr over 90 Minutes Intravenous Every 12 hours 06/05/17 2116 06/06/17 1201   06/06/17 0000  piperacillin-tazobactam (ZOSYN) IVPB 3.375 g  Status:  Discontinued     3.375 g 12.5 mL/hr over 240 Minutes Intravenous Every 8 hours 06/05/17 1745 06/06/17 1201   06/05/17 1800  vancomycin (VANCOCIN) IVPB 1000 mg/200 mL premix  Status:  Discontinued     1,000 mg 200 mL/hr over 60 Minutes Intravenous Every 8 hours 06/05/17 1745 06/05/17 2116   06/05/17 1738  vancomycin (VANCOCIN) 500 MG powder    Comments:  Manson PasseyBrown, Adam   : cabinet override      06/05/17 1738 06/06/17 0544   06/05/17 1715  piperacillin-tazobactam (ZOSYN) IVPB 3.375 g     3.375 g 100 mL/hr over 30 Minutes Intravenous  Once 06/05/17 1700 06/05/17 1805   06/05/17 1715  vancomycin (VANCOCIN) IVPB 1000 mg/200 mL premix  Status:  Discontinued     1,000 mg 200 mL/hr over 60 Minutes Intravenous  Once 06/05/17 1700 06/05/17 1713   06/05/17 1715  vancomycin (VANCOCIN) 1,500 mg in sodium chloride 0.9 % 500 mL IVPB     1,500 mg 250 mL/hr over 120 Minutes Intravenous  Once 06/05/17 1713 06/05/17 1913      Medications: Scheduled Meds: . enoxaparin (LOVENOX) injection  40 mg Subcutaneous Q24H  . folic acid  1 mg Oral Daily  . multivitamin with minerals  1 tablet Oral Daily  . thiamine  100 mg Intravenous Daily   Continuous Infusions: .  ceFAZolin  (ANCEF) IV Stopped (06/07/17 0100)   PRN Meds:.acetaminophen **OR** acetaminophen, LORazepam, naLOXone (NARCAN)  injection, ondansetron **OR** ondansetron (ZOFRAN) IV    Objective: Weight change:   Intake/Output Summary (Last 24 hours) at 06/07/2017 0836 Last data filed at 06/07/2017 0500 Gross per 24 hour  Intake 680 ml  Output 4075 ml  Net -3395 ml   Blood pressure 134/81, pulse 91, temperature 98.6 F (37 C), temperature source Oral, resp. rate (!) 32, height 5\' 10"  (1.778 m), weight 145 lb 11.6 oz (66.1 kg), SpO2 92 %. Temp:  [98.4 F (36.9 C)-98.7 F (37.1 C)] 98.6 F (37 C) (11/17 0720) Resp:  [16-32] 32 (11/17 0700) BP: (103-134)/(53-81) 134/81 (11/17 0700)  Physical Exam: General: Alert and awake, oriented x3, covering himself in blankets HEENT: anicteric sclera, pupils reactive to light and accommodation, EOMI CVS tachyrate, normal r,  no murmur rubs or gallops Chest: clear to auscultation bilaterally, no wheezing, rales or rhonchi Abdomen: soft nontender, nondistended, normal bowel sounds, Extremities: no  clubbing or edema noted bilaterally Skin: track marks Neuro: nonfocal  CBC:  CBC Latest Ref Rng & Units 06/07/2017 06/06/2017 06/05/2017  WBC 4.0 - 10.5 K/uL 12.7(H) 26.1(H) 23.9(H)  Hemoglobin 13.0 - 17.0 g/dL 10.3(L) 11.3(L) 11.3(L)  Hematocrit 39.0 - 52.0 %  29.3(L) 32.5(L) 33.3(L)  Platelets 150 - 400 K/uL 100(L) 130(L) 134(L)      BMET Recent Labs    06/06/17 0039 06/07/17 0358  NA 138 136  K 3.8 3.2*  CL 112* 107  CO2 22 23  GLUCOSE 138* 101*  BUN 9 8  CREATININE 0.83 0.75  CALCIUM 8.3* 8.7*     Liver Panel  Recent Labs    06/06/17 0039 06/07/17 0358  PROT 5.3* 5.8*  ALBUMIN 2.8* 3.0*  AST 58* 34  ALT 51 39  ALKPHOS 102 86  BILITOT 0.8 0.6       Sedimentation Rate Recent Labs    06/07/17 0358  ESRSEDRATE 23*   C-Reactive Protein No results for input(s): CRP in the last 72 hours.  Micro Results: Recent Results  (from the past 720 hour(s))  Blood Culture (routine x 2)     Status: None (Preliminary result)   Collection Time: 06/05/17  4:40 PM  Result Value Ref Range Status   Specimen Description BLOOD BLOOD LEFT HAND  Final   Special Requests   Final    BOTTLES DRAWN AEROBIC AND ANAEROBIC Blood Culture adequate volume   Culture  Setup Time   Final    GRAM POSITIVE COCCI ANAEROBIC BOTTLE ONLY Organism ID to follow CRITICAL VALUE NOTED.  VALUE IS CONSISTENT WITH PREVIOUSLY REPORTED AND CALLED VALUE. Performed at [facility], [address], [place], [place] [address]    Culture GRAM POSITIVE COCCI  Final   Report Status PENDING  Incomplete  Blood Culture ID Panel (Reflexed)     Status: Abnormal   Collection Time: 06/05/17  4:40 PM  Result Value Ref Range Status   Enterococcus species NOT DETECTED NOT DETECTED Final   Listeria monocytogenes NOT DETECTED NOT DETECTED Final   Staphylococcus species DETECTED (A) NOT DETECTED Final    Comment: CRITICAL VALUE NOTED.  VALUE IS CONSISTENT WITH PREVIOUSLY REPORTED AND CALLED VALUE.   Staphylococcus aureus DETECTED (A) NOT DETECTED Final    Comment: Methicillin (oxacillin) susceptible Staphylococcus aureus (MSSA). Preferred therapy is anti staphylococcal beta lactam antibiotic (Cefazolin or Nafcillin), unless clinically contraindicated. CRITICAL VALUE NOTED.  VALUE IS CONSISTENT WITH PREVIOUSLY REPORTED AND CALLED VALUE.    Methicillin resistance NOT DETECTED NOT DETECTED Final   Streptococcus species NOT DETECTED NOT DETECTED Final   Streptococcus agalactiae NOT DETECTED NOT DETECTED Final   Streptococcus pneumoniae NOT DETECTED NOT DETECTED Final   Streptococcus pyogenes NOT DETECTED NOT DETECTED Final   Acinetobacter baumannii NOT DETECTED NOT DETECTED Final   Enterobacteriaceae species NOT DETECTED NOT DETECTED Final   Enterobacter cloacae complex NOT DETECTED NOT DETECTED Final   Escherichia coli NOT DETECTED NOT DETECTED Final    Klebsiella oxytoca NOT DETECTED NOT DETECTED Final   Klebsiella pneumoniae NOT DETECTED NOT DETECTED Final   Proteus species NOT DETECTED NOT DETECTED Final   Serratia marcescens NOT DETECTED NOT DETECTED Final   Haemophilus influenzae NOT DETECTED NOT DETECTED Final   Neisseria meningitidis NOT DETECTED NOT DETECTED Final   Pseudomonas aeruginosa NOT DETECTED NOT DETECTED Final   Candida albicans NOT DETECTED NOT DETECTED Final   Candida glabrata NOT DETECTED NOT DETECTED Final   Candida krusei NOT DETECTED NOT DETECTED Final   Candida parapsilosis NOT DETECTED NOT DETECTED Final   Candida tropicalis NOT DETECTED NOT DETECTED Final    Comment: Performed at [facility], [address], [place], [place] [address]  Blood Culture (routine x 2)     Status: None (  Preliminary result)   Collection Time: 06/05/17  5:30 PM  Result Value Ref Range Status   Specimen Description BLOOD LEFT ANTECUBITAL  Final   Special Requests   Final    IN PEDIATRIC BOTTLE Blood Culture results may not be optimal due to an excessive volume of blood received in culture bottles   Culture  Setup Time   Final    GRAM POSITIVE COCCI IN CLUSTERS IN PEDIATRIC BOTTLE Organism ID to follow CRITICAL RESULT CALLED TO, READ BACK BY AND VERIFIED WITH: N. Glogovac Pharm.D. 11:20 06/06/17 ([email or web address]) Performed at [facility], [address], [place], [place] [address]    Culture GRAM POSITIVE COCCI  Final   Report Status PENDING  Incomplete  Blood Culture ID Panel (Reflexed)     Status: Abnormal   Collection Time: 06/05/17  5:30 PM  Result Value Ref Range Status   Enterococcus species NOT DETECTED NOT DETECTED Final   Listeria monocytogenes NOT DETECTED NOT DETECTED Final   Staphylococcus species DETECTED (A) NOT DETECTED Final    Comment: CRITICAL RESULT CALLED TO, READ BACK BY AND VERIFIED WITH: N. Glogovac Pharm.D. 11:20 06/06/17 ([email or web address])    Staphylococcus aureus DETECTED (A) NOT DETECTED Final     Comment: Methicillin (oxacillin) susceptible Staphylococcus aureus (MSSA). Preferred therapy is anti staphylococcal beta lactam antibiotic (Cefazolin or Nafcillin), unless clinically contraindicated. CRITICAL RESULT CALLED TO, READ BACK BY AND VERIFIED WITH: N. Glogovac Pharm.D. 11:20 06/06/17 ([email or web address])    Methicillin resistance NOT DETECTED NOT DETECTED Final   Streptococcus species NOT DETECTED NOT DETECTED Final   Streptococcus agalactiae NOT DETECTED NOT DETECTED Final   Streptococcus pneumoniae NOT DETECTED NOT DETECTED Final   Streptococcus pyogenes NOT DETECTED NOT DETECTED Final   Acinetobacter baumannii NOT DETECTED NOT DETECTED Final   Enterobacteriaceae species NOT DETECTED NOT DETECTED Final   Enterobacter cloacae complex NOT DETECTED NOT DETECTED Final   Escherichia coli NOT DETECTED NOT DETECTED Final   Klebsiella oxytoca NOT DETECTED NOT DETECTED Final   Klebsiella pneumoniae NOT DETECTED NOT DETECTED Final   Proteus species NOT DETECTED NOT DETECTED Final   Serratia marcescens NOT DETECTED NOT DETECTED Final   Haemophilus influenzae NOT DETECTED NOT DETECTED Final   Neisseria meningitidis NOT DETECTED NOT DETECTED Final   Pseudomonas aeruginosa NOT DETECTED NOT DETECTED Final   Candida albicans NOT DETECTED NOT DETECTED Final   Candida glabrata NOT DETECTED NOT DETECTED Final   Candida krusei NOT DETECTED NOT DETECTED Final   Candida parapsilosis NOT DETECTED NOT DETECTED Final   Candida tropicalis NOT DETECTED NOT DETECTED Final    Comment: Performed at [facility], [address], [place], [place] [address]  Urine culture     Status: None   Collection Time: 06/05/17  5:49 PM  Result Value Ref Range Status   Specimen Description URINE, CLEAN CATCH  Final   Special Requests NONE  Final   Culture   Final    NO GROWTH Performed at [facility], [address], [place], [place] [address]    Report Status 06/07/2017 FINAL  Final  MRSA PCR Screening      Status: None   Collection Time: 06/05/17  9:15 PM  Result Value Ref Range Status   MRSA by PCR NEGATIVE NEGATIVE Final    Comment:        The GeneXpert MRSA Assay (FDA approved for NASAL specimens only), is one component of a comprehensive MRSA colonization surveillance program. It is not intended to diagnose MRSA  infection nor to guide or monitor treatment for MRSA infections.     Studies/Results: Dg Chest 2 View  Result Date: 06/05/2017 CLINICAL DATA:  Fever. EXAM: CHEST  2 VIEW COMPARISON:  Radiograph of November 18, 2015. FINDINGS: The heart size and mediastinal contours are within normal limits. Both lungs are clear. No pneumothorax or pleural effusion is noted. The visualized skeletal structures are unremarkable. IMPRESSION: No active cardiopulmonary disease. Electronically Signed   By: Lupita RaiderJames  Green Jr, M.D.   On: 06/05/2017 17:27   Ct Head Wo Contrast  Result Date: 06/06/2017 CLINICAL DATA:  Chronic heroin use with headache and altered mental status EXAM: CT HEAD WITHOUT CONTRAST TECHNIQUE: Contiguous axial images were obtained from the base of the skull through the vertex without intravenous contrast. COMPARISON:  Report prior study January 18, 2007 available. Images from that study not available. FINDINGS: Brain: The ventricles are normal in size and configuration. The right lateral ventricle is slightly larger than the left lateral ventricle. Prior studies are not available for direct comparison to confirm that this finding represents an anatomic variant. Statistically, it most likely represents an anatomic variant. There is no appreciable mass, acute hemorrhage, extra-axial fluid collection, or midline shift. There is calcification in a somewhat linear distribution in the superior right temporal lobe. No other calcification noted of this nature elsewhere. Elsewhere gray-white compartments appear normal. No evident acute infarct. Vascular: No hyperdense vessel. No calcification in  expected vascular regions evident. Skull: The bony calvarium appears intact. Sinuses/Orbits: There is mucosal thickening in the left maxillary antrum as well as in several ethmoid air cells. Other visualized paranasal sinuses are clear. Orbits appear symmetric bilaterally. Other: The mastoid air cells are clear. IMPRESSION: 1. Calcification in a somewhat linear distribution in the right temporal lobe. This calcification is of uncertain etiology. It may have posttraumatic etiology. This calcification may also represent residua from a vascular type malformation. Potentially MR with intravenous contrast could be helpful for further delineation in this area. 2. No acute hemorrhage evident. No mass or extra-axial fluid collection. No acute infarct evident. Elsewhere gray-white compartments appear normal. 3.  There are areas of paranasal sinus disease. Electronically Signed   By: Bretta BangWilliam  Woodruff III M.D.   On: 06/06/2017 10:15      Assessment/Plan:  INTERVAL HISTORY:  Cultures have been persistently positive   Principal Problem:   Severe sepsis with septic shock [facility](HCC) Active Problems:   Polysubstance abuse (HCC)   Staphylococcus aureus bacteremia with sepsis (HCC)    David SchlatterRichard Sweetland is a 36 y.o. male with  Active IVDU and admission for sepsis due to MSSA.  #1Tressie Ellis.      [place] Antimicrobial Management Team Staphylococcus aureus bacteremia   Staphylococcus aureus bacteremia (SAB) is associated with a high rate of complications and mortality.  Specific aspects of clinical management are critical to optimizing the outcome of patients with SAB.  Therefore, the [facility] Antim[facility] Management Team [facility]) has initiated an intervention aimed at improving the management of SAB at [facility].  To do so, Infectious Diseases physicians are providing an evidence-based consult for the management of all patients with SAB.     Yes No Comments  Perform follow-up blood cultures (even if the patient is  afebrile) to ensure clearance of bacteremia [x]  []  Cultures repeated AGAIN  Remove vascular catheter and obtain follow-up blood cultures after the removal of the catheter []  []  DO NOT PLACE PICC OR CENTRAL LINE  Perform echocardiography to evaluate for endocarditis (transthoracic ECHO is  40-50% sensitive, TEE is > 90% sensitive) []  []  Please keep in mind, that neither test can definitively EXCLUDE endocarditis, and that should clinical suspicion remain high for endocarditis the patient should then still be treated with an "endocarditis" duration of therapy = 6 weeks  He needs TEE  Consult electrophysiologist to evaluate implanted cardiac device (pacemaker, ICD) []  []    Ensure source control []  []  Have all abscesses been drained effectively? Have deep seeded infections (septic joints or osteomyelitis) had appropriate surgical debridement?  Source other than IVDU unclear   Investigate for "metastatic" sites of infection []  []  Does the patient have ANY symptom or physical exam finding that would suggest a deeper infection (back or neck pain that may be suggestive of vertebral osteomyelitis or epidural abscess, muscle pain that could be a symptom of pyomyositis)?  Keep in mind that for deep seeded infections MRI imaging with contrast is preferred rather than other often insensitive tests such as plain x-rays, especially early in a patient's presentation.  No other specific symptoms pointing to metastatic site  Change antibiotic therapy to cefazolin []  []  Beta-lactam antibiotics are preferred for MSSA due to higher cure rates.   If on Vancomycin, goal trough should be 15 - 20 mcg/mL  Estimated duration of IV antibiotic therapy: Conventional therapy would be 4-6 weeks IV cefazolin in a setting where he would have a difficult time using IV drugs i.e. in this hospital or in a nursing facility. []  []  Consult case management for probably prolonged outpatient IV antibiotic therapy    #2 IV drug use he needs  to be engaged in a Suboxone clinic after he is stabilizing from treatment for this current infection.  #3  Hepatitis C: I have repeated his RNA to determine whether he is clearing this from his infection in May or whether he has established chronic infection.  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  HIV should be rescreened as well.  4.  Depression with passive suicidal ideation: Psychiatry should be consulted.  LOS: 2 days   Acey Lav 06/07/2017, 8:36 AM

## 2017-06-08 ENCOUNTER — Other Ambulatory Visit: Payer: Self-pay

## 2017-06-08 DIAGNOSIS — L03114 Cellulitis of left upper limb: Secondary | ICD-10-CM

## 2017-06-08 LAB — CBC WITH DIFFERENTIAL/PLATELET
Basophils Absolute: 0 10*3/uL (ref 0.0–0.1)
Basophils Relative: 0 %
EOS PCT: 0 %
Eosinophils Absolute: 0 10*3/uL (ref 0.0–0.7)
HCT: 31.9 % — ABNORMAL LOW (ref 39.0–52.0)
HEMOGLOBIN: 11 g/dL — AB (ref 13.0–17.0)
LYMPHS ABS: 1.5 10*3/uL (ref 0.7–4.0)
LYMPHS PCT: 11 %
MCH: 29.4 pg (ref 26.0–34.0)
MCHC: 34.5 g/dL (ref 30.0–36.0)
MCV: 85.3 fL (ref 78.0–100.0)
Monocytes Absolute: 0.6 10*3/uL (ref 0.1–1.0)
Monocytes Relative: 5 %
NEUTROS PCT: 84 %
Neutro Abs: 10.9 10*3/uL — ABNORMAL HIGH (ref 1.7–7.7)
Platelets: 154 10*3/uL (ref 150–400)
RBC: 3.74 MIL/uL — AB (ref 4.22–5.81)
RDW: 12.8 % (ref 11.5–15.5)
WBC: 13 10*3/uL — AB (ref 4.0–10.5)

## 2017-06-08 LAB — HCV RNA QUANT RFLX ULTRA OR GENOTYP
HCV RNA Qnt(log copy/mL): UNDETERMINED log10 IU/mL
HepC Qn: NOT DETECTED IU/mL

## 2017-06-08 LAB — CULTURE, BLOOD (ROUTINE X 2): Special Requests: ADEQUATE

## 2017-06-08 LAB — BASIC METABOLIC PANEL
Anion gap: 8 (ref 5–15)
BUN: 10 mg/dL (ref 6–20)
CHLORIDE: 104 mmol/L (ref 101–111)
CO2: 27 mmol/L (ref 22–32)
Calcium: 8.7 mg/dL — ABNORMAL LOW (ref 8.9–10.3)
Creatinine, Ser: 0.77 mg/dL (ref 0.61–1.24)
GFR calc Af Amer: >60 mL/min (ref >60)
GFR calc non Af Amer: >60 mL/min (ref >60)
GLUCOSE: 112 mg/dL — AB (ref 65–99)
POTASSIUM: 2.8 mmol/L — AB (ref 3.5–5.1)
Sodium: 139 mmol/L (ref 135–145)

## 2017-06-08 LAB — MAGNESIUM: Magnesium: 1.7 mg/dL (ref 1.7–2.4)

## 2017-06-08 LAB — C-REACTIVE PROTEIN: CRP: 4.1 mg/dL — ABNORMAL HIGH (ref <1.0)

## 2017-06-08 LAB — HIV ANTIBODY (ROUTINE TESTING W REFLEX): HIV Screen 4th Generation wRfx: NONREACTIVE

## 2017-06-08 MED ORDER — POTASSIUM CHLORIDE CRYS ER 20 MEQ PO TBCR
40.0000 meq | EXTENDED_RELEASE_TABLET | Freq: Once | ORAL | Status: AC
  Administered 2017-06-08: 40 meq via ORAL
  Filled 2017-06-08: qty 2

## 2017-06-08 MED ORDER — VITAMIN B-1 100 MG PO TABS
100.0000 mg | ORAL_TABLET | Freq: Every day | ORAL | Status: DC
  Administered 2017-06-08: 100 mg via ORAL
  Filled 2017-06-08: qty 1

## 2017-06-08 MED ORDER — ORITAVANCIN DIPHOSPHATE 400 MG IV SOLR
1200.0000 mg | Freq: Once | INTRAVENOUS | Status: AC
  Administered 2017-06-08: 1200 mg via INTRAVENOUS
  Filled 2017-06-08: qty 80

## 2017-06-08 MED ORDER — NICOTINE 21 MG/24HR TD PT24: 21.0000 mg | MEDICATED_PATCH | Freq: Every day | TRANSDERMAL | Status: DC

## 2017-06-08 MED ORDER — POTASSIUM CHLORIDE 10 MEQ/100ML IV SOLN
10.0000 meq | INTRAVENOUS | Status: AC
  Filled 2017-06-08 (×2): qty 100

## 2017-06-08 NOTE — Progress Notes (Signed)
Subjective:  The patient tells me he is definitely leaving today   Antibiotics:  Anti-infectives (From admission, onward)   Start     Dose/Rate Route Frequency Ordered Stop   06/08/17 1130  Oritavancin Diphosphate (ORBACTIV) 1,200 mg in dextrose 5 % IVPB     1,200 mg 333.3 mL/hr over 180 Minutes Intravenous Once 06/08/17 0932     06/06/17 1600  ceFAZolin (ANCEF) IVPB 2g/100 mL premix     2 g 200 mL/hr over 30 Minutes Intravenous Every 8 hours 06/06/17 1201     06/06/17 0400  vancomycin (VANCOCIN) 1,250 mg in sodium chloride 0.9 % 250 mL IVPB  Status:  Discontinued     1,250 mg 166.7 mL/hr over 90 Minutes Intravenous Every 12 hours 06/05/17 2116 06/06/17 1201   06/06/17 0000  piperacillin-tazobactam (ZOSYN) IVPB 3.375 g  Status:  Discontinued     3.375 g 12.5 mL/hr over 240 Minutes Intravenous Every 8 hours 06/05/17 1745 06/06/17 1201   06/05/17 1800  vancomycin (VANCOCIN) IVPB 1000 mg/200 mL premix  Status:  Discontinued     1,000 mg 200 mL/hr over 60 Minutes Intravenous Every 8 hours 06/05/17 1745 06/05/17 2116   06/05/17 1738  vancomycin (VANCOCIN) 500 MG powder    Comments:  Manson PasseyBrown, Adam   : cabinet override      06/05/17 1738 06/06/17 0544   06/05/17 1715  piperacillin-tazobactam (ZOSYN) IVPB 3.375 g     3.375 g 100 mL/hr over 30 Minutes Intravenous  Once 06/05/17 1700 06/05/17 1805   06/05/17 1715  vancomycin (VANCOCIN) IVPB 1000 mg/200 mL premix  Status:  Discontinued     1,000 mg 200 mL/hr over 60 Minutes Intravenous  Once 06/05/17 1700 06/05/17 1713   06/05/17 1715  vancomycin (VANCOCIN) 1,500 mg in sodium chloride 0.9 % 500 mL IVPB     1,500 mg 250 mL/hr over 120 Minutes Intravenous  Once 06/05/17 1713 06/05/17 1913      Medications: Scheduled Meds: . enoxaparin (LOVENOX) injection  40 mg Subcutaneous Q24H  . folic acid  1 mg Oral Daily  . multivitamin with minerals  1 tablet Oral Daily  . thiamine  100 mg Oral Daily   Continuous Infusions: .   ceFAZolin (ANCEF) IV Stopped (06/08/17 16100924)  . oritavancin (ORBACTIV) IVPB 1,200 mg (06/08/17 1142)  . potassium chloride     PRN Meds:.acetaminophen **OR** acetaminophen, LORazepam, naLOXone (NARCAN)  injection, ondansetron **OR** ondansetron (ZOFRAN) IV    Objective: Weight change:   Intake/Output Summary (Last 24 hours) at 06/08/2017 1244 Last data filed at 06/08/2017 1236 Gross per 24 hour  Intake 460 ml  Output 1850 ml  Net -1390 ml   Blood pressure 129/82, pulse 61, temperature 98.4 F (36.9 C), temperature source Oral, resp. rate (!) 34, height 5\' 10"  (1.778 m), weight 134 lb 11.2 oz (61.1 kg), SpO2 100 %. Temp:  [98.2 F (36.8 C)-99.2 F (37.3 C)] 98.4 F (36.9 C) (11/18 1200) Pulse Rate:  [53-96] 61 (11/18 1200) Resp:  [14-34] 34 (11/18 1200) BP: (103-133)/(45-87) 129/82 (11/18 0800) SpO2:  [91 %-100 %] 100 % (11/18 1200) Weight:  [134 lb 11.2 oz (61.1 kg)] 134 lb 11.2 oz (61.1 kg) (11/18 0446)  Physical Exam: General: Alert and awake, oriented x3, very anxious to leave the hospital and apparently threatening to pull out IVs HEENT: anicteric sclera, pupils reactive to light and accommodation, EOMI CVS tachyrate, normal r,  no murmur rubs or gallops Chest: clear to auscultation bilaterally,  no wheezing, rales or rhonchi Abdomen: soft nontender, nondistended, normal bowel sounds, Extremities: no  clubbing or edema noted bilaterally Skin: track marks, ? cellulitic area on arm though not worsening Neuro: nonfocal  CBC:  CBC Latest Ref Rng & Units 06/08/2017 06/07/2017 06/06/2017  WBC 4.0 - 10.5 K/uL 13.0(H) 12.7(H) 26.1(H)  Hemoglobin 13.0 - 17.0 g/dL 11.0(L) 10.3(L) 11.3(L)  Hematocrit 39.0 - 52.0 % 31.9(L) 29.3(L) 32.5(L)  Platelets 150 - 400 K/uL 154 100(L) 130(L)      BMET Recent Labs    06/07/17 0358 06/08/17 0322  NA 136 139  K 3.2* 2.8*  CL 107 104  CO2 23 27  GLUCOSE 101* 112*  BUN 8 10  CREATININE 0.75 0.77  CALCIUM 8.7* 8.7*     Liver  Panel  Recent Labs    06/06/17 0039 06/07/17 0358  PROT 5.3* 5.8*  ALBUMIN 2.8* 3.0*  AST 58* 34  ALT 51 39  ALKPHOS 102 86  BILITOT 0.8 0.6       Sedimentation Rate Recent Labs    06/07/17 0358  ESRSEDRATE 23*   C-Reactive Protein Recent Labs    06/07/17 0358 06/08/17 0322  CRP 8.7* 4.1*    Micro Results: Recent Results (from the past 720 hour(s))  Blood Culture (routine x 2)     Status: Abnormal   Collection Time: 06/05/17  4:40 PM  Result Value Ref Range Status   Specimen Description BLOOD BLOOD LEFT HAND  Final   Special Requests   Final    BOTTLES DRAWN AEROBIC AND ANAEROBIC Blood Culture adequate volume   Culture  Setup Time   Final    GRAM POSITIVE COCCI ANAEROBIC BOTTLE ONLY CRITICAL VALUE NOTED.  VALUE IS CONSISTENT WITH PREVIOUSLY REPORTED AND CALLED VALUE.    Culture (A)  Final    STAPHYLOCOCCUS AUREUS SUSCEPTIBILITIES PERFORMED ON PREVIOUS CULTURE WITHIN THE LAST 5 DAYS. Performed at [facility], [address] [place], [place] [address]    Report Status 06/08/2017 FINAL  Final  Blood Culture ID Panel (Reflexed)     Status: Abnormal   Collection Time: 06/05/17  4:40 PM  Result Value Ref Range Status   Enterococcus species NOT DETECTED NOT DETECTED Final   Listeria monocytogenes NOT DETECTED NOT DETECTED Final   Staphylococcus species DETECTED (A) NOT DETECTED Final    Comment: CRITICAL VALUE NOTED.  VALUE IS CONSISTENT WITH PREVIOUSLY REPORTED AND CALLED VALUE.   Staphylococcus aureus DETECTED (A) NOT DETECTED Final    Comment: Methicillin (oxacillin) susceptible Staphylococcus aureus (MSSA). Preferred therapy is anti staphylococcal beta lactam antibiotic (Cefazolin or Nafcillin), unless clinically contraindicated. CRITICAL VALUE NOTED.  VALUE IS CONSISTENT WITH PREVIOUSLY REPORTED AND CALLED VALUE.    Methicillin resistance NOT DETECTED NOT DETECTED Final   Streptococcus species NOT DETECTED NOT DETECTED Final   Streptococcus  agalactiae NOT DETECTED NOT DETECTED Final   Streptococcus pneumoniae NOT DETECTED NOT DETECTED Final   Streptococcus pyogenes NOT DETECTED NOT DETECTED Final   Acinetobacter baumannii NOT DETECTED NOT DETECTED Final   Enterobacteriaceae species NOT DETECTED NOT DETECTED Final   Enterobacter cloacae complex NOT DETECTED NOT DETECTED Final   Escherichia coli NOT DETECTED NOT DETECTED Final   Klebsiella oxytoca NOT DETECTED NOT DETECTED Final   Klebsiella pneumoniae NOT DETECTED NOT DETECTED Final   Proteus species NOT DETECTED NOT DETECTED Final   Serratia marcescens NOT DETECTED NOT DETECTED Final   Haemophilus influenzae NOT DETECTED NOT DETECTED Final   Neisseria meningitidis NOT DETECTED NOT DETECTED Final  Pseudomonas aeruginosa NOT DETECTED NOT DETECTED Final   Candida albicans NOT DETECTED NOT DETECTED Final   Candida glabrata NOT DETECTED NOT DETECTED Final   Candida krusei NOT DETECTED NOT DETECTED Final   Candida parapsilosis NOT DETECTED NOT DETECTED Final   Candida tropicalis NOT DETECTED NOT DETECTED Final    Comment: Performed at [facility], [address] [place], [place] [address]  Blood Culture (routine x 2)     Status: Abnormal   Collection Time: 06/05/17  5:30 PM  Result Value Ref Range Status   Specimen Description BLOOD LEFT ANTECUBITAL  Final   Special Requests   Final    IN PEDIATRIC BOTTLE Blood Culture results may not be optimal due to an excessive volume of blood received in culture bottles   Culture  Setup Time   Final    GRAM POSITIVE COCCI IN CLUSTERS IN PEDIATRIC BOTTLE CRITICAL RESULT CALLED TO, READ BACK BY AND VERIFIED WITH: N. Glogovac Pharm.D. 11:20 06/06/17 ([email or web address])    Culture STAPHYLOCOCCUS AUREUS (A)  Final   Report Status 06/08/2017 FINAL  Final   Organism ID, Bacteria STAPHYLOCOCCUS AUREUS  Final      Susceptibility   Staphylococcus aureus - MIC*    CIPROFLOXACIN <=0.5 SENSITIVE Sensitive     ERYTHROMYCIN >=8 RESISTANT Resistant       GENTAMICIN <=0.5 SENSITIVE Sensitive     OXACILLIN 0.5 SENSITIVE Sensitive     TETRACYCLINE <=1 SENSITIVE Sensitive     VANCOMYCIN 1 SENSITIVE Sensitive     TRIMETH/SULFA <=10 SENSITIVE Sensitive     CLINDAMYCIN <=0.25 SENSITIVE Sensitive     RIFAMPIN <=0.5 SENSITIVE Sensitive     Inducible Clindamycin NEGATIVE Sensitive     * STAPHYLOCOCCUS AUREUS  Blood Culture ID Panel (Reflexed)     Status: Abnormal   Collection Time: 06/05/17  5:30 PM  Result Value Ref Range Status   Enterococcus species NOT DETECTED NOT DETECTED Final   Listeria monocytogenes NOT DETECTED NOT DETECTED Final   Staphylococcus species DETECTED (A) NOT DETECTED Final    Comment: CRITICAL RESULT CALLED TO, READ BACK BY AND VERIFIED WITH: N. Glogovac Pharm.D. 11:20 06/06/17 ([email or web address])    Staphylococcus aureus DETECTED (A) NOT DETECTED Final    Comment: Methicillin (oxacillin) susceptible Staphylococcus aureus (MSSA). Preferred therapy is anti staphylococcal beta lactam antibiotic (Cefazolin or Nafcillin), unless clinically contraindicated. CRITICAL RESULT CALLED TO, READ BACK BY AND VERIFIED WITH: N. Glogovac Pharm.D. 11:20 06/06/17 ([email or web address])    Methicillin resistance NOT DETECTED NOT DETECTED Final   Streptococcus species NOT DETECTED NOT DETECTED Final   Streptococcus agalactiae NOT DETECTED NOT DETECTED Final   Streptococcus pneumoniae NOT DETECTED NOT DETECTED Final   Streptococcus pyogenes NOT DETECTED NOT DETECTED Final   Acinetobacter baumannii NOT DETECTED NOT DETECTED Final   Enterobacteriaceae species NOT DETECTED NOT DETECTED Final   Enterobacter cloacae complex NOT DETECTED NOT DETECTED Final   Escherichia coli NOT DETECTED NOT DETECTED Final   Klebsiella oxytoca NOT DETECTED NOT DETECTED Final   Klebsiella pneumoniae NOT DETECTED NOT DETECTED Final   Proteus species NOT DETECTED NOT DETECTED Final   Serratia marcescens NOT DETECTED NOT DETECTED Final   Haemophilus influenzae NOT DETECTED NOT  DETECTED Final   Neisseria meningitidis NOT DETECTED NOT DETECTED Final   Pseudomonas aeruginosa NOT DETECTED NOT DETECTED Final   Candida albicans NOT DETECTED NOT DETECTED Final   Candida glabrata NOT DETECTED NOT DETECTED Final   Candida krusei NOT DETECTED NOT DETECTED Final   Candida parapsilosis NOT DETECTED  NOT DETECTED Final   Candida tropicalis NOT DETECTED NOT DETECTED Final    Comment: Performed at [facility], [address], [place], [place] [address]  Urine culture     Status: None   Collection Time: 06/05/17  5:49 PM  Result Value Ref Range Status   Specimen Description URINE, CLEAN CATCH  Final   Special Requests NONE  Final   Culture   Final    NO GROWTH Performed at [facility], [address], [place], [place] [address]    Report Status 06/07/2017 FINAL  Final  MRSA PCR Screening     Status: None   Collection Time: 06/05/17  9:15 PM  Result Value Ref Range Status   MRSA by PCR NEGATIVE NEGATIVE Final    Comment:        The GeneXpert MRSA Assay (FDA approved for NASAL specimens only), is one component of a comprehensive MRSA colonization surveillance program. It is not intended to diagnose MRSA infection nor to guide or monitor treatment for MRSA infections.   Culture, blood (Routine X 2) w Reflex to ID Panel     Status: None (Preliminary result)   Collection Time: 06/06/17 12:36 PM  Result Value Ref Range Status   Specimen Description BLOOD RIGHT HAND  Final   Special Requests   Final    BOTTLES DRAWN AEROBIC AND ANAEROBIC Blood Culture adequate volume   Culture   Final    NO GROWTH 2 DAYS Performed at [facility], [address], [place], [place] [address]    Report Status PENDING  Incomplete  Culture, blood (Routine X 2) w Reflex to ID Panel     Status: None (Preliminary result)   Collection Time: 06/06/17 12:37 PM  Result Value Ref Range Status   Specimen Description BLOOD LEFT ANTECUBITAL  Final   Special Requests   Final     BOTTLES DRAWN AEROBIC AND ANAEROBIC Blood Culture adequate volume   Culture   Final    NO GROWTH 2 DAYS Performed at [facility], [address], [place], [place] [address]    Report Status PENDING  Incomplete    Studies/Results: No results found.    Assessment/Plan:  INTERVAL HISTORY: he was threatening to leave AGAINST MEDICAL ADVICE  Principal Problem:   Severe sepsis with septic shock (HCC) Active Problems:   Polysubstance abuse (HCC)   Staphylococcus aureus bacteremia with sepsis (HCC)   Passive suicidal ideations   Chronic viral hepatitis B without delta agent and without coma (HCC)   Chronic hepatitis C without hepatic coma (HCC)    David [name] is a 36 y.o. male with  Active IVDU and admission for sepsis due to MSSA.  #1[facility]imicrobial Management Team Staphylococcus aureus bacteremia   Staphylococcus aureus bacteremia (SAB) is associated with a high rate of complications and mortality.  Specific aspects of clinical management are critical to optimizing the outcome of patients with SAB.  Therefore, the [facility] Antimicrobial Management Team [facility]) has initiated an intervention aimed at improving the management of SAB at [facility].  To do so, Infectious Diseases physicians are providing an evidence-based consult for the management of all patients with SAB.     Yes No Comments  Perform follow-up blood cultures (even if the patient is afebrile) to ensure clearance of bacteremia [x]  []  Cultures repeated AGAIN now NO GROWTH x 2 days  Remove vascular catheter and obtain follow-up blood cultures  after the removal of the catheter []  []  DO NOT PLACE PICC OR CENTRAL LINE  Perform echocardiography to evaluate for endocarditis (transthoracic ECHO is 40-50% sensitive, TEE is > 90% sensitive) []  []  Please keep in mind, that neither test can definitively EXCLUDE endocarditis, and that should clinical suspicion remain high for endocarditis the patient  should then still be treated with an "endocarditis" duration of therapy = 6 weeks  He needs TEE but he may not stay long enough for one if he leaves today  Consult electrophysiologist to evaluate implanted cardiac device (pacemaker, ICD) []  []    Ensure source control []  []  Have all abscesses been drained effectively? Have deep seeded infections (septic joints or osteomyelitis) had appropriate surgical debridement?  Source other than IVDU unclear, ? Area of cellulitis is stable   Investigate for "metastatic" sites of infection []  []  Does the patient have ANY symptom or physical exam finding that would suggest a deeper infection (back or neck pain that may be suggestive of vertebral osteomyelitis or epidural abscess, muscle pain that could be a symptom of pyomyositis)?  Keep in mind that for deep seeded infections MRI imaging with contrast is preferred rather than other often insensitive tests such as plain x-rays, especially early in a patient's presentation.  No other specific symptoms pointing to metastatic site  Change antibiotic therapy to cefazolin []  []  Beta-lactam antibiotics are preferred for MSSA due to higher cure rates.   If on Vancomycin, goal trough should be 15 - 20 mcg/mL  Estimated duration of IV antibiotic therapy: Conventional therapy would be 4-6 weeks IV cefazolin in a setting where he would have a difficult time using IV drugs i.e. in this hospital or in a nursing facility.  Today he is threatening to leave AMA and I will give him a dose of long acting ORITAVANCIN to cover him for at least 2 weeks  If he stays I will also continue the cefazolin []  []  Consult case management for probably prolonged outpatient IV antibiotic therapy    #2 IV drug use he needs to be engaged in a Suboxone clinic after he is stabilizing from treatment for this current infection.  #3  Hepatitis C: I have repeated his RNA to determine whether he is clearing this from his infection in May or whether  he has established chronic infection.  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  HIV should be rescreened as well.  4.  Depression with passive suicidal ideation: Psychiatry have cleared him  5 Cellulitis: stable but this could be manifestation of IVD injection and cause of his bacteremia as above will give ORITAVANCIN.   LOS: 3 days   Acey Lav 06/08/2017, 12:44 PM

## 2017-06-08 NOTE — Progress Notes (Signed)
Patient left AMA and signed the paperwork.  MD aware after discussions with both Dr. Hanley BenAlekh and Dr. Jannifer FranklinAkintayo.

## 2017-06-08 NOTE — Progress Notes (Signed)
Key Points: Use following P&T approved IV to PO non-antibiotic change policy.  Description contains the criteria that are approved Note: Policy Excludes:  Esophagectomy patientsPHARMACIST - PHYSICIAN COMMUNICATION DR:   Hanley BenAlekh CONCERNING: IV to Oral Route Change Policy  RECOMMENDATION: This patient is receiving thiamine by the intravenous route.  Based on criteria approved by the Pharmacy and Therapeutics Committee, the intravenous medication(s) is/are being converted to the equivalent oral dose form(s).   DESCRIPTION: These criteria include:  The patient is eating (either orally or via tube) and/or has been taking other orally administered medications for a least 24 hours  The patient has no evidence of active gastrointestinal bleeding or impaired GI absorption (gastrectomy, short bowel, patient on TNA or NPO).  If you have questions about this conversion, please contact the Pharmacy Department  []   [phone]( [phone] )  [facility] []   [phone]( [phone] )  [facility]  []   [phone]( [phone] )  [facility] [x]   [phone]( [phone] )  [facility]  Earl ManyLegge, Rykker Coviello WoodlynMarshall, [facility] 06/08/2017 9:32 AM

## 2017-06-08 NOTE — Progress Notes (Signed)
Patient ID: David [name], male   DOB: [birth year], 36 y.o.   MRN: [id number]  PROGRESS NOTE    David [name]  XLK:[id number] DOB: [birth year] DOA: 06/05/2017 PCP: Patient, No Pcp Per   Brief Narrative:  36 year old male with history of iv drug abuse presented with generalized body ache, fever and chills. He was found to have MSSA bacteremia. ID was consulted. Psychiatry was consulted for severe depression and intermittent passive suicidal ideation.   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  -Hemodynamically stable - Antibiotics as per ID recommendations  MSSA bacteremia  - TTE negative for vegetations -Continue cefazolin as per ID -Repeat blood cultures negative so far  Leukocytosis -Probably second above.  Monitor  Hypokalemia -Replace and repeat a.m. labs  Ongoing intravenous drug use -Monitor mental status. -will follow CIWA protocol and use PRN narcan if needed -Social worker consult  Severe depression with question of passive suicidal ideation -Psychiatry consulted.  No need for inpatient psychiatric hospitalization as per psychiatry. -Continue bedside sitter  History of hepatitis C -Follow HCV RNA; follow with ID  Transaminitis -in the presence of sepsis and hep C -Resolved  Thrombocytopenia -due to sepsis most likely -Improved    DVT prophylaxis: Lovenox Code Status: Full Family Communication: None at bedside Disposition Plan: Depends on clinical outcome  Consultants: ID and psychiatriy  Procedures: 2D echo on 06/06/2017 Study Conclusions  - Left ventricle: The cavity size was normal. Systolic function was   normal.  The estimated ejection fraction was in the range of 60%   to 65%. Wall motion was normal; there were no regional wall   motion abnormalities. Left ventricular diastolic function   parameters were normal.  Impressions:  - There was no evidence of a vegetation.  Recommendations:  Consider transesophageal echocardiography if clinically indicated in order to exclude endocarditis.  Antimicrobials:   Vanc and Zosyn 11/15-11/16 Cefazolin 06/06/17 onwards  Subjective: Patient seen and examined at bedside.  He is very sleepy but wakes up on calling his name and answers some questions; most of the sentences are incompletely understood.  He is a poor historian.  No overnight fever or vomiting.  Objective: Vitals:   06/08/17 0541 06/08/17 0700 06/08/17 0800 06/08/17 1200  BP: 103/64  129/82   Pulse: 71 (!) 58 (!) 55 61  Resp: 18 (!) 34 (!) 21 (!) 34  Temp:   98.6 F (37 C) 98.4 F (36.9 C)  TempSrc:   Oral Oral  SpO2: 100% 100% 100% 100%  Weight:      Height:        Intake/Output Summary (Last 24 hours) at 06/08/2017 1238 Last data filed at 06/08/2017 1236 Gross per 24 hour  Intake 460 ml  Output 1850 ml  Net -1390 ml   Filed Weights   06/05/17 1610 06/06/17 0500 06/08/17 0446  Weight: 79.4 kg (175 lb) 66.1 kg (145 lb 11.6 oz) 61.1 kg (134 lb 11.2 oz)    Examination:  General exam: Appears sleepy but wakes up on calling his name Respiratory system: Bilateral decreased breath sound at bases; tachypnea Cardiovascular system: S1 & S2 heard, intermittent bradycardia Gastrointestinal system: Abdomen is nondistended, soft and nontender.  Normal bowel sounds heard. Extremities: No cyanosis, clubbing, edema   Data Reviewed: I have personally reviewed following labs and imaging studies  CBC: Recent Labs  Lab 06/05/17 1638 06/05/17 2149 06/06/17 0039 06/07/17 0358 06/08/17 0322  WBC 8.2 23.9* 26.1* 12.7* 13.0*  NEUTROABS 7.9*  --  24.7*  --  10.9*  HGB 13.2 11.3* 11.3*  10.3* 11.0*  HCT 38.8* 33.3* 32.5* 29.3* 31.9*  MCV 88.0 87.4 86.7 85.7 85.3  PLT 146* 134* 130* 100* 154   Basic Metabolic Panel: Recent Labs  Lab 06/05/17 1638 06/05/17 2149 06/06/17 0039 06/07/17 0358 06/08/17 0322  NA 135  --  138 136 139  K 3.2*  --  3.8 3.2* 2.8*  CL 100*  --  112* 107 104  CO2 27  --  22 23 27   GLUCOSE 107*  --  138* 101* 112*  BUN 10  --  9 8 10   CREATININE 0.74 0.99 0.83 0.75 0.77  CALCIUM 9.4  --  8.3* 8.7* 8.7*  MG  --   --   --   --  1.7   GFR: Estimated Creatinine Clearance: 110.3 mL/min (by C-G formula based on SCr of 0.77 mg/dL). Liver Function Tests: Recent Labs  Lab 06/05/17 1638 06/06/17 0039 06/07/17 0358  AST 86* 58* 34  ALT 70* 51 39  ALKPHOS 184* 102 86  BILITOT 1.5* 0.8 0.6  PROT 7.0 5.3* 5.8*  ALBUMIN 3.6 2.8* 3.0*   No results for input(s): LIPASE, AMYLASE in the last 168 hours. Recent Labs  Lab 06/06/17 0656  AMMONIA 33   Coagulation Profile: Recent Labs  Lab 06/05/17 2149  INR 1.43   Cardiac Enzymes: No results for input(s): CKTOTAL, CKMB, CKMBINDEX, TROPONINI in the last 168 hours. BNP (last 3 results) No results for input(s): PROBNP in the last 8760 hours. HbA1C: No results for input(s): HGBA1C in the last 72 hours. CBG: No results for input(s): GLUCAP in the last 168 hours. Lipid Profile: No results for input(s): CHOL, HDL, LDLCALC, TRIG, CHOLHDL, LDLDIRECT in the last 72 hours. Thyroid Function Tests: No results for input(s): TSH, T4TOTAL, FREET4, T3FREE, THYROIDAB in the last 72 hours. Anemia Panel: No results for input(s): VITAMINB12, FOLATE, FERRITIN, TIBC, IRON, RETICCTPCT in the last 72 hours. Sepsis Labs: Recent Labs  Lab 06/05/17 1655 06/05/17 1900 06/05/17 2149 06/06/17 0039  PROCALCITON  --   --  110.54  --   LATICACIDVEN 5.91* 4.26* 3.2* 3.1*    Recent Results (from the past 240 hour(s))  Blood Culture (routine x 2)     Status: Abnormal   Collection Time: 06/05/17  4:40 PM  Result  Value Ref Range Status   Specimen Description BLOOD BLOOD LEFT HAND  Final   Special Requests   Final    BOTTLES DRAWN AEROBIC AND ANAEROBIC Blood Culture adequate volume   Culture  Setup Time   Final    GRAM POSITIVE COCCI ANAEROBIC BOTTLE ONLY CRITICAL VALUE NOTED.  VALUE IS CONSISTENT WITH PREVIOUSLY REPORTED AND CALLED VALUE.    Culture (A)  Final    STAPHYLOCOCCUS AUREUS SUSCEPTIBILITIES PERFORMED ON PREVIOUS CULTURE WITHIN THE LAST 5 DAYS. Performed at [facility], [address], [place], [place] [address]    Report Status 06/08/2017 FINAL  Final  Blood Culture ID Panel (Reflexed)     Status: Abnormal   Collection Time: 06/05/17  4:40 PM  Result Value Ref Range Status   Enterococcus species NOT DETECTED NOT DETECTED Final   Listeria monocytogenes  NOT DETECTED NOT DETECTED Final   Staphylococcus species DETECTED (A) NOT DETECTED Final    Comment: CRITICAL VALUE NOTED.  VALUE IS CONSISTENT WITH PREVIOUSLY REPORTED AND CALLED VALUE.   Staphylococcus aureus DETECTED (A) NOT DETECTED Final    Comment: Methicillin (oxacillin) susceptible Staphylococcus aureus (MSSA). Preferred therapy is anti staphylococcal beta lactam antibiotic (Cefazolin or Nafcillin), unless clinically contraindicated. CRITICAL VALUE NOTED.  VALUE IS CONSISTENT WITH PREVIOUSLY REPORTED AND CALLED VALUE.    Methicillin resistance NOT DETECTED NOT DETECTED Final   Streptococcus species NOT DETECTED NOT DETECTED Final   Streptococcus agalactiae NOT DETECTED NOT DETECTED Final   Streptococcus pneumoniae NOT DETECTED NOT DETECTED Final   Streptococcus pyogenes NOT DETECTED NOT DETECTED Final   Acinetobacter baumannii NOT DETECTED NOT DETECTED Final   Enterobacteriaceae species NOT DETECTED NOT DETECTED Final   Enterobacter cloacae complex NOT DETECTED NOT DETECTED Final   Escherichia coli NOT DETECTED NOT DETECTED Final   Klebsiella oxytoca NOT DETECTED NOT DETECTED Final   Klebsiella pneumoniae NOT  DETECTED NOT DETECTED Final   Proteus species NOT DETECTED NOT DETECTED Final   Serratia marcescens NOT DETECTED NOT DETECTED Final   Haemophilus influenzae NOT DETECTED NOT DETECTED Final   Neisseria meningitidis NOT DETECTED NOT DETECTED Final   Pseudomonas aeruginosa NOT DETECTED NOT DETECTED Final   Candida albicans NOT DETECTED NOT DETECTED Final   Candida glabrata NOT DETECTED NOT DETECTED Final   Candida krusei NOT DETECTED NOT DETECTED Final   Candida parapsilosis NOT DETECTED NOT DETECTED Final   Candida tropicalis NOT DETECTED NOT DETECTED Final    Comment: Performed at [facility]  [facility], [address] [place], [place] [address]  Blood Culture (routine x 2)     Status: Abnormal   Collection Time: 06/05/17  5:30 PM  Result Value Ref Range Status   Specimen Description BLOOD LEFT ANTECUBITAL  Final   Special Requests   Final    IN PEDIATRIC BOTTLE Blood Culture results may not be optimal due to an excessive volume of blood received in culture bottles   Culture  Setup Time   Final    GRAM POSITIVE COCCI IN CLUSTERS IN PEDIATRIC BOTTLE CRITICAL RESULT CALLED TO, READ BACK BY AND VERIFIED WITH: N. Glogovac Pharm.D. 11:20 06/06/17 ([email or web address])    Culture STAPHYLOCOCCUS AUREUS (A)  Final   Report Status 06/08/2017 FINAL  Final   Organism ID, Bacteria STAPHYLOCOCCUS AUREUS  Final      Susceptibility   Staphylococcus aureus - MIC*    CIPROFLOXACIN <=0.5 SENSITIVE Sensitive     ERYTHROMYCIN >=8 RESISTANT Resistant     GENTAMICIN <=0.5 SENSITIVE Sensitive     OXACILLIN 0.5 SENSITIVE Sensitive     TETRACYCLINE <=1 SENSITIVE Sensitive     VANCOMYCIN 1 SENSITIVE Sensitive     TRIMETH/SULFA <=10 SENSITIVE Sensitive     CLINDAMYCIN <=0.25 SENSITIVE Sensitive     RIFAMPIN <=0.5 SENSITIVE Sensitive     Inducible Clindamycin NEGATIVE Sensitive     * STAPHYLOCOCCUS AUREUS  Blood Culture ID Panel (Reflexed)     Status: Abnormal   Collection Time: 06/05/17  5:30 PM  Result Value Ref  Range Status   Enterococcus species NOT DETECTED NOT DETECTED Final   Listeria monocytogenes NOT DETECTED NOT DETECTED Final   Staphylococcus species DETECTED (A) NOT DETECTED Final    Comment: CRITICAL RESULT CALLED TO, READ BACK BY AND VERIFIED WITH: N. Glogovac Pharm.D. 11:20 06/06/17 ([email or web address])    Staphylococcus aureus DETECTED (A) NOT DETECTED Final    Comment:  Methicillin (oxacillin) susceptible Staphylococcus aureus (MSSA). Preferred therapy is anti staphylococcal beta lactam antibiotic (Cefazolin or Nafcillin), unless clinically contraindicated. CRITICAL RESULT CALLED TO, READ BACK BY AND VERIFIED WITH: N. Glogovac Pharm.D. 11:20 06/06/17 ([email or web address])    Methicillin resistance NOT DETECTED NOT DETECTED Final   Streptococcus species NOT DETECTED NOT DETECTED Final   Streptococcus agalactiae NOT DETECTED NOT DETECTED Final   Streptococcus pneumoniae NOT DETECTED NOT DETECTED Final   Streptococcus pyogenes NOT DETECTED NOT DETECTED Final   Acinetobacter baumannii NOT DETECTED NOT DETECTED Final   Enterobacteriaceae species NOT DETECTED NOT DETECTED Final   Enterobacter cloacae complex NOT DETECTED NOT DETECTED Final   Escherichia coli NOT DETECTED NOT DETECTED Final   Klebsiella oxytoca NOT DETECTED NOT DETECTED Final   Klebsiella pneumoniae NOT DETECTED NOT DETECTED Final   Proteus species NOT DETECTED NOT DETECTED Final   Serratia marcescens NOT DETECTED NOT DETECTED Final   Haemophilus influenzae NOT DETECTED NOT DETECTED Final   Neisseria meningitidis NOT DETECTED NOT DETECTED Final   Pseudomonas aeruginosa NOT DETECTED NOT DETECTED Final   Candida albicans NOT DETECTED NOT DETECTED Final   Candida glabrata NOT DETECTED NOT DETECTED Final   Candida krusei NOT DETECTED NOT DETECTED Final   Candida parapsilosis NOT DETECTED NOT DETECTED Final   Candida tropicalis NOT DETECTED NOT DETECTED Final    Comment: Performed at [facility], [address] [place], [place] [address]   Urine culture     Status: None   Collection Time: 06/05/17  5:49 PM  Result Value Ref Range Status   Specimen Description URINE, CLEAN CATCH  Final   Special Requests NONE  Final   Culture   Final    NO GROWTH Performed at [facility], [address] [place], [place] [address]    Report Status 06/07/2017 FINAL  Final  MRSA PCR Screening     Status: None   Collection Time: 06/05/17  9:15 PM  Result Value Ref Range Status   MRSA by PCR NEGATIVE NEGATIVE Final    Comment:        The GeneXpert MRSA Assay (FDA approved for NASAL specimens only), is one component of a comprehensive MRSA colonization surveillance program. It is not intended to diagnose MRSA infection nor to guide or monitor treatment for MRSA infections.   Culture, blood (Routine X 2) w Reflex to ID Panel     Status: None (Preliminary result)   Collection Time: 06/06/17 12:36 PM  Result Value Ref Range Status   Specimen Description BLOOD RIGHT HAND  Final   Special Requests   Final    BOTTLES DRAWN AEROBIC AND ANAEROBIC Blood Culture adequate volume   Culture   Final    NO GROWTH 2 DAYS Performed at [facility], [address], [place], [place] [address]    Report Status PENDING  Incomplete  Culture, blood (Routine X 2) w Reflex to ID Panel     Status: None (Preliminary result)   Collection Time: 06/06/17 12:37 PM  Result Value Ref Range Status   Specimen Description BLOOD LEFT ANTECUBITAL  Final   Special Requests   Final    BOTTLES DRAWN AEROBIC AND ANAEROBIC Blood Culture adequate volume   Culture   Final    NO GROWTH 2 DAYS Performed at [facility], [address], [place], [place] [address]    Report Status PENDING  Incomplete         Radiology Studies: No results found.  Scheduled Meds: . enoxaparin (LOVENOX) injection  40 mg Subcutaneous Q24H  . folic acid  1 mg Oral Daily  . multivitamin with minerals  1 tablet Oral Daily  . thiamine  100 mg Oral Daily    Continuous Infusions: .  ceFAZolin (ANCEF) IV Stopped (06/08/17 [id number])  . oritavancin (ORBACTIV) IVPB 1,200 mg (06/08/17 1142)  . potassium chloride       LOS: 3 days        Glade LloydKshitiz Breshae Belcher, MD [facility] Pager [phone]  If 7PM-7AM, please contact night-coverage [email or web address] Password [email or web address] 06/08/2017, 12:38 PM

## 2017-06-09 LAB — HEPATITIS A ANTIBODY, TOTAL: HEP A TOTAL AB: NEGATIVE

## 2017-06-09 NOTE — Discharge Summary (Signed)
Physician Discharge Summary  David [name]:[id number] DOB: 07-29-1980 DOA: 06/05/2017  PCP: Patient, No Pcp Per  Admit date: 06/05/2017 Discharge date: Signed out AMA on 06/08/2017  Admitted From: Home Disposition:  Signed out AMA   Home Health: No Equipment/Devices: None  Discharge Condition: Guarded CODE STATUS: Full  Brief/Interim Summary: 36 year old male with history of iv drug abuse presented with generalized body ache, fever and chills. He was found to have MSSA bacteremia. ID was consulted. Psychiatry was consulted for severe depression and intermittent passive suicidal ideation.  Patient Signed out AMA on 06/08/2017 despite explaining the risks of signing out AGAINST MEDICAL ADVICE including worsening of medical condition including death.   Discharge Diagnoses:  Principal Problem:   Sepsis (HCC) Active Problems:   Polysubstance abuse (HCC)   Staphylococcus aureus bacteremia with sepsis (HCC)   Passive suicidal ideations   Chronic viral hepatitis B without delta agent and without coma (HCC)   Chronic hepatitis C without hepatic coma (HCC)   Cellulitis of left upper extremity  Sepsis -Due to MSSA bacteremia.  -Resolved  MSSA bacteremia  - TTE negative for vegetations -ID following and patient was on cefazolin.  ID had recommended TEE which was being planned.    Leukocytosis -Probably second above.  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  No need for inpatient psychiatric hospitalization as per psychiatry.  History of hepatitis C -Follow HCV RNA; follow with ID  Transaminitis -in the presence of sepsis and hep C -Resolved  Thrombocytopenia -due to sepsis most likely -Improved    Discharge Instructions   Allergies as of 06/08/2017   No  Known Allergies     Medication List    You have not been prescribed any medications.     No Known Allergies  Consultations:  ID  Psychiatry   Procedures/Studies: Dg Chest 2 View  Result Date: 06/05/2017 CLINICAL DATA:  Fever. EXAM: CHEST  2 VIEW COMPARISON:  Radiograph of November 18, 2015. FINDINGS: The heart size and mediastinal contours are within normal limits. Both lungs are clear. No pneumothorax or pleural effusion is noted. The visualized skeletal structures are unremarkable. IMPRESSION: No active cardiopulmonary disease. Electronically Signed   By: Lupita Raider, M.D.   On: 06/05/2017 17:27   Ct Head Wo Contrast  Result Date: 06/06/2017 CLINICAL DATA:  Chronic heroin use with headache and altered mental status EXAM: CT HEAD WITHOUT CONTRAST TECHNIQUE: Contiguous axial images were obtained from the base of the skull through the vertex without intravenous contrast. COMPARISON:  Report prior study January 18, 2007 available. Images from that study not available. FINDINGS: Brain: The ventricles are normal in size and configuration. The right lateral ventricle is slightly larger than the left lateral ventricle. Prior studies are not available for direct comparison to confirm that this finding represents an anatomic variant. Statistically, it most likely represents an anatomic variant. There is no appreciable mass, acute hemorrhage, extra-axial fluid collection, or midline shift. There is calcification in a somewhat linear distribution in the superior right temporal lobe. No other calcification noted of this nature elsewhere. Elsewhere gray-white compartments appear normal. No evident acute infarct. Vascular: No hyperdense vessel. No calcification in expected vascular regions evident. Skull: The bony calvarium appears intact. Sinuses/Orbits: There is mucosal thickening in the left maxillary antrum as well as in several ethmoid air cells. Other visualized  paranasal sinuses are clear. Orbits  appear symmetric bilaterally. Other: The mastoid air cells are clear. IMPRESSION: 1. Calcification in a somewhat linear distribution in the right temporal lobe. This calcification is of uncertain etiology. It may have posttraumatic etiology. This calcification may also represent residua from a vascular type malformation. Potentially MR with intravenous contrast could be helpful for further delineation in this area. 2. No acute hemorrhage evident. No mass or extra-axial fluid collection. No acute infarct evident. Elsewhere gray-white compartments appear normal. 3.  There are areas of paranasal sinus disease. Electronically Signed   By: Bretta Bang III M.D.   On: 06/06/2017 10:15    2D echo on 06/06/2017 Study Conclusions  - Left ventricle: The cavity size was normal. Systolic function was normal. The estimated ejection fraction was in the range of 60% to 65%. Wall motion was normal; there were no regional wall motion abnormalities. Left ventricular diastolic function parameters were normal.  Impressions:  - There was no evidence of a vegetation.  Recommendations: Consider transesophageal echocardiography if clinically indicated in order to exclude endocarditis      The results of significant diagnostics from this hospitalization (including imaging, microbiology, ancillary and laboratory) are listed below for reference.     Microbiology: Recent Results (from the past 240 hour(s))  Blood Culture (routine x 2)     Status: Abnormal   Collection Time: 06/05/17  4:40 PM  Result Value Ref Range Status   Specimen Description BLOOD BLOOD LEFT HAND  Final   Special Requests   Final    BOTTLES DRAWN AEROBIC AND ANAEROBIC Blood Culture adequate volume   Culture  Setup Time   Final    GRAM POSITIVE COCCI ANAEROBIC BOTTLE ONLY CRITICAL VALUE NOTED.  VALUE IS CONSISTENT WITH PREVIOUSLY REPORTED AND CALLED VALUE.    Culture (A)  Final    STAPHYLOCOCCUS  AUREUS SUSCEPTIBILITIES PERFORMED ON PREVIOUS CULTURE WITHIN THE LAST 5 DAYS. Performed at [facility], [address] [place], [place] [address]    Report Status 06/08/2017 FINAL  Final  Blood Culture ID Panel (Reflexed)     Status: Abnormal   Collection Time: 06/05/17  4:40 PM  Result Value Ref Range Status   Enterococcus species NOT DETECTED NOT DETECTED Final   Listeria monocytogenes NOT DETECTED NOT DETECTED Final   Staphylococcus species DETECTED (A) NOT DETECTED Final    Comment: CRITICAL VALUE NOTED.  VALUE IS CONSISTENT WITH PREVIOUSLY REPORTED AND CALLED VALUE.   Staphylococcus aureus DETECTED (A) NOT DETECTED Final    Comment: Methicillin (oxacillin) susceptible Staphylococcus aureus (MSSA). Preferred therapy is anti staphylococcal beta lactam antibiotic (Cefazolin or Nafcillin), unless clinically contraindicated. CRITICAL VALUE NOTED.  VALUE IS CONSISTENT WITH PREVIOUSLY REPORTED AND CALLED VALUE.    Methicillin resistance NOT DETECTED NOT DETECTED Final   Streptococcus species NOT DETECTED NOT DETECTED Final   Streptococcus agalactiae NOT DETECTED NOT DETECTED Final   Streptococcus pneumoniae NOT DETECTED NOT DETECTED Final   Streptococcus pyogenes NOT DETECTED NOT DETECTED Final   Acinetobacter baumannii NOT DETECTED NOT DETECTED Final   Enterobacteriaceae species NOT DETECTED NOT DETECTED Final   Enterobacter cloacae complex NOT DETECTED NOT DETECTED Final   Escherichia coli NOT DETECTED NOT DETECTED Final   Klebsiella oxytoca NOT DETECTED NOT DETECTED Final   Klebsiella pneumoniae NOT DETECTED NOT DETECTED Final   Proteus species NOT DETECTED NOT DETECTED Final   Serratia marcescens NOT DETECTED NOT DETECTED Final   Haemophilus influenzae NOT DETECTED NOT DETECTED Final   Neisseria meningitidis  NOT DETECTED NOT DETECTED Final   Pseudomonas aeruginosa NOT DETECTED NOT DETECTED Final   Candida albicans NOT DETECTED NOT DETECTED Final   Candida glabrata NOT  DETECTED NOT DETECTED Final   Candida krusei NOT DETECTED NOT DETECTED Final   Candida parapsilosis NOT DETECTED NOT DETECTED Final   Candida tropicalis NOT DETECTED NOT DETECTED Final    Comment: Performed at [facility], [address] [place], [place] [address]  Blood Culture (routine x 2)     Status: Abnormal   Collection Time: 06/05/17  5:30 PM  Result Value Ref Range Status   Specimen Description BLOOD LEFT ANTECUBITAL  Final   Special Requests   Final    IN PEDIATRIC BOTTLE Blood Culture results may not be optimal due to an excessive volume of blood received in culture bottles   Culture  Setup Time   Final    GRAM POSITIVE COCCI IN CLUSTERS IN PEDIATRIC BOTTLE CRITICAL RESULT CALLED TO, READ BACK BY AND VERIFIED WITH: N. Glogovac Pharm.D. 11:20 06/06/17 ([email or web address])    Culture STAPHYLOCOCCUS AUREUS (A)  Final   Report Status 06/08/2017 FINAL  Final   Organism ID, Bacteria STAPHYLOCOCCUS AUREUS  Final      Susceptibility   Staphylococcus aureus - MIC*    CIPROFLOXACIN <=0.5 SENSITIVE Sensitive     ERYTHROMYCIN >=8 RESISTANT Resistant     GENTAMICIN <=0.5 SENSITIVE Sensitive     OXACILLIN 0.5 SENSITIVE Sensitive     TETRACYCLINE <=1 SENSITIVE Sensitive     VANCOMYCIN 1 SENSITIVE Sensitive     TRIMETH/SULFA <=10 SENSITIVE Sensitive     CLINDAMYCIN <=0.25 SENSITIVE Sensitive     RIFAMPIN <=0.5 SENSITIVE Sensitive     Inducible Clindamycin NEGATIVE Sensitive     * STAPHYLOCOCCUS AUREUS  Blood Culture ID Panel (Reflexed)     Status: Abnormal   Collection Time: 06/05/17  5:30 PM  Result Value Ref Range Status   Enterococcus species NOT DETECTED NOT DETECTED Final   Listeria monocytogenes NOT DETECTED NOT DETECTED Final   Staphylococcus species DETECTED (A) NOT DETECTED Final    Comment: CRITICAL RESULT CALLED TO, READ BACK BY AND VERIFIED WITH: N. Glogovac Pharm.D. 11:20 06/06/17 ([email or web address])    Staphylococcus aureus DETECTED (A) NOT DETECTED Final    Comment: Methicillin  (oxacillin) susceptible Staphylococcus aureus (MSSA). Preferred therapy is anti staphylococcal beta lactam antibiotic (Cefazolin or Nafcillin), unless clinically contraindicated. CRITICAL RESULT CALLED TO, READ BACK BY AND VERIFIED WITH: N. Glogovac Pharm.D. 11:20 06/06/17 ([email or web address])    Methicillin resistance NOT DETECTED NOT DETECTED Final   Streptococcus species NOT DETECTED NOT DETECTED Final   Streptococcus agalactiae NOT DETECTED NOT DETECTED Final   Streptococcus pneumoniae NOT DETECTED NOT DETECTED Final   Streptococcus pyogenes NOT DETECTED NOT DETECTED Final   Acinetobacter baumannii NOT DETECTED NOT DETECTED Final   Enterobacteriaceae species NOT DETECTED NOT DETECTED Final   Enterobacter cloacae complex NOT DETECTED NOT DETECTED Final   Escherichia coli NOT DETECTED NOT DETECTED Final   Klebsiella oxytoca NOT DETECTED NOT DETECTED Final   Klebsiella pneumoniae NOT DETECTED NOT DETECTED Final   Proteus species NOT DETECTED NOT DETECTED Final   Serratia marcescens NOT DETECTED NOT DETECTED Final   Haemophilus influenzae NOT DETECTED NOT DETECTED Final   Neisseria meningitidis NOT DETECTED NOT DETECTED Final   Pseudomonas aeruginosa NOT DETECTED NOT DETECTED Final   Candida albicans NOT DETECTED NOT DETECTED Final   Candida glabrata NOT DETECTED NOT DETECTED Final   Candida krusei NOT DETECTED NOT DETECTED Final  Candida parapsilosis NOT DETECTED NOT DETECTED Final   Candida tropicalis NOT DETECTED NOT DETECTED Final    Comment: Performed at [facility], [address], [place], [place] [address]  Urine culture     Status: None   Collection Time: 06/05/17  5:49 PM  Result Value Ref Range Status   Specimen Description URINE, CLEAN CATCH  Final   Special Requests NONE  Final   Culture   Final    NO GROWTH Performed at [facility], [address], [place], [place] [address]    Report Status 06/07/2017 FINAL  Final  MRSA PCR Screening     Status: None    Collection Time: 06/05/17  9:15 PM  Result Value Ref Range Status   MRSA by PCR NEGATIVE NEGATIVE Final    Comment:        The GeneXpert MRSA Assay (FDA approved for NASAL specimens only), is one component of a comprehensive MRSA colonization surveillance program. It is not intended to diagnose MRSA infection nor to guide or monitor treatment for MRSA infections.   Culture, blood (Routine X 2) w Reflex to ID Panel     Status: None (Preliminary result)   Collection Time: 06/06/17 12:36 PM  Result Value Ref Range Status   Specimen Description BLOOD RIGHT HAND  Final   Special Requests   Final    BOTTLES DRAWN AEROBIC AND ANAEROBIC Blood Culture adequate volume   Culture   Final    NO GROWTH 3 DAYS Performed at [facility], [address], [place], [place] [address]    Report Status PENDING  Incomplete  Culture, blood (Routine X 2) w Reflex to ID Panel     Status: None (Preliminary result)   Collection Time: 06/06/17 12:37 PM  Result Value Ref Range Status   Specimen Description BLOOD LEFT ANTECUBITAL  Final   Special Requests   Final    BOTTLES DRAWN AEROBIC AND ANAEROBIC Blood Culture adequate volume   Culture   Final    NO GROWTH 3 DAYS Performed at [facility], [address] [place], [place] [address]    Report Status PENDING  Incomplete     Labs: BNP (last 3 results) No results for input(s): BNP in the last 8760 hours. Basic Metabolic Panel: Recent Labs  Lab 06/05/17 1638 06/05/17 2149 06/06/17 0039 06/07/17 0358 06/08/17 0322  NA 135  --  138 136 139  K 3.2*  --  3.8 3.2* 2.8*  CL 100*  --  112* 107 104  CO2 27  --  22 23 27   GLUCOSE 107*  --  138* 101* 112*  BUN 10  --  9 8 10   CREATININE 0.74 0.99 0.83 0.75 0.77  CALCIUM 9.4  --  8.3* 8.7* 8.7*  MG  --   --   --   --  1.7   Liver Function Tests: Recent Labs  Lab 06/05/17 1638 06/06/17 0039 06/07/17 0358  AST 86* 58* 34  ALT 70* 51 39  ALKPHOS 184* 102 86  BILITOT 1.5* 0.8 0.6   PROT 7.0 5.3* 5.8*  ALBUMIN 3.6 2.8* 3.0*   No results for input(s): LIPASE, AMYLASE in the last 168 hours. Recent Labs  Lab 06/06/17 0656  AMMONIA 33   CBC: Recent Labs  Lab 06/05/17 1638 06/05/17 2149 06/06/17 0039 06/07/17 0358 06/08/17 0322  WBC 8.2 23.9* 26.1* 12.7* 13.0*  NEUTROABS 7.9*  --  24.7*  --  10.9*  HGB 13.2 11.3* 11.3* 10.3* 11.0*  HCT 38.8* 33.3* 32.5* 29.3* 31.9*  MCV 88.0 87.4 86.7 85.7 85.3  PLT 146* 134* 130* 100* 154   Cardiac Enzymes: No results for input(s): CKTOTAL, CKMB, CKMBINDEX, TROPONINI in the last 168 hours. BNP: Invalid input(s): POCBNP CBG: No results for input(s): GLUCAP in the last 168 hours. D-Dimer No results for input(s): DDIMER in the last 72 hours. Hgb A1c No results for input(s): HGBA1C in the last 72 hours. Lipid Profile No results for input(s): CHOL, HDL, LDLCALC, TRIG, CHOLHDL, LDLDIRECT in the last 72 hours. Thyroid function studies No results for input(s): TSH, T4TOTAL, T3FREE, THYROIDAB in the last 72 hours.  Invalid input(s): FREET3 Anemia work up No results for input(s): VITAMINB12, FOLATE, FERRITIN, TIBC, IRON, RETICCTPCT in the last 72 hours. Urinalysis    Component Value Date/Time   COLORURINE YELLOW 06/05/2017 1749   APPEARANCEUR CLEAR 06/05/2017 1749   LABSPEC <1.005 (L) 06/05/2017 1749   PHURINE 6.0 06/05/2017 1749   GLUCOSEU NEGATIVE 06/05/2017 1749   HGBUR NEGATIVE 06/05/2017 1749   BILIRUBINUR NEGATIVE 06/05/2017 1749   KETONESUR NEGATIVE 06/05/2017 1749   PROTEINUR NEGATIVE 06/05/2017 1749   UROBILINOGEN 1.0 06/29/2007 0150   NITRITE NEGATIVE 06/05/2017 1749   LEUKOCYTESUR NEGATIVE 06/05/2017 1749   Sepsis Labs Invalid input(s): PROCALCITONIN,  WBC,  LACTICIDVEN Microbiology Recent Results (from the past 240 hour(s))  Blood Culture (routine x 2)     Status: Abnormal   Collection Time: 06/05/17  4:40 PM  Result Value Ref Range Status   Specimen Description BLOOD BLOOD LEFT HAND  Final    Special Requests   Final    BOTTLES DRAWN AEROBIC AND ANAEROBIC Blood Culture adequate volume   Culture  Setup Time   Final    GRAM POSITIVE COCCI ANAEROBIC BOTTLE ONLY CRITICAL VALUE NOTED.  VALUE IS CONSISTENT WITH PREVIOUSLY REPORTED AND CALLED VALUE.    Culture (A)  Final    STAPHYLOCOCCUS AUREUS SUSCEPTIBILITIES PERFORMED ON PREVIOUS CULTURE WITHIN THE LAST 5 DAYS. Performed at [facility], [address] [place], [place] [address]    Report Status 06/08/2017 FINAL  Final  Blood Culture ID Panel (Reflexed)     Status: Abnormal   Collection Time: 06/05/17  4:40 PM  Result Value Ref Range Status   Enterococcus species NOT DETECTED NOT DETECTED Final   Listeria monocytogenes NOT DETECTED NOT DETECTED Final   Staphylococcus species DETECTED (A) NOT DETECTED Final    Comment: CRITICAL VALUE NOTED.  VALUE IS CONSISTENT WITH PREVIOUSLY REPORTED AND CALLED VALUE.   Staphylococcus aureus DETECTED (A) NOT DETECTED Final    Comment: Methicillin (oxacillin) susceptible Staphylococcus aureus (MSSA). Preferred therapy is anti staphylococcal beta lactam antibiotic (Cefazolin or Nafcillin), unless clinically contraindicated. CRITICAL VALUE NOTED.  VALUE IS CONSISTENT WITH PREVIOUSLY REPORTED AND CALLED VALUE.    Methicillin resistance NOT DETECTED NOT DETECTED Final   Streptococcus species NOT DETECTED NOT DETECTED Final   Streptococcus agalactiae NOT DETECTED NOT DETECTED Final   Streptococcus pneumoniae NOT DETECTED NOT DETECTED Final   Streptococcus pyogenes NOT DETECTED NOT DETECTED Final   Acinetobacter baumannii NOT DETECTED NOT DETECTED Final   Enterobacteriaceae species NOT DETECTED NOT DETECTED Final   Enterobacter cloacae complex NOT DETECTED NOT DETECTED Final   Escherichia coli NOT DETECTED NOT DETECTED Final   Klebsiella oxytoca NOT DETECTED NOT DETECTED Final   Klebsiella pneumoniae NOT DETECTED NOT DETECTED Final   Proteus species NOT DETECTED NOT DETECTED Final    Serratia marcescens NOT DETECTED  NOT DETECTED Final   Haemophilus influenzae NOT DETECTED NOT DETECTED Final   Neisseria meningitidis NOT DETECTED NOT DETECTED Final   Pseudomonas aeruginosa NOT DETECTED NOT DETECTED Final   Candida albicans NOT DETECTED NOT DETECTED Final   Candida glabrata NOT DETECTED NOT DETECTED Final   Candida krusei NOT DETECTED NOT DETECTED Final   Candida parapsilosis NOT DETECTED NOT DETECTED Final   Candida tropicalis NOT DETECTED NOT DETECTED Final    Comment: Performed at [facility], [address] [place], [place] [address]  Blood Culture (routine x 2)     Status: Abnormal   Collection Time: 06/05/17  5:30 PM  Result Value Ref Range Status   Specimen Description BLOOD LEFT ANTECUBITAL  Final   Special Requests   Final    IN PEDIATRIC BOTTLE Blood Culture results may not be optimal due to an excessive volume of blood received in culture bottles   Culture  Setup Time   Final    GRAM POSITIVE COCCI IN CLUSTERS IN PEDIATRIC BOTTLE CRITICAL RESULT CALLED TO, READ BACK BY AND VERIFIED WITH: N. Glogovac Pharm.D. 11:20 06/06/17 ([email or web address])    Culture STAPHYLOCOCCUS AUREUS (A)  Final   Report Status 06/08/2017 FINAL  Final   Organism ID, Bacteria STAPHYLOCOCCUS AUREUS  Final      Susceptibility   Staphylococcus aureus - MIC*    CIPROFLOXACIN <=0.5 SENSITIVE Sensitive     ERYTHROMYCIN >=8 RESISTANT Resistant     GENTAMICIN <=0.5 SENSITIVE Sensitive     OXACILLIN 0.5 SENSITIVE Sensitive     TETRACYCLINE <=1 SENSITIVE Sensitive     VANCOMYCIN 1 SENSITIVE Sensitive     TRIMETH/SULFA <=10 SENSITIVE Sensitive     CLINDAMYCIN <=0.25 SENSITIVE Sensitive     RIFAMPIN <=0.5 SENSITIVE Sensitive     Inducible Clindamycin NEGATIVE Sensitive     * STAPHYLOCOCCUS AUREUS  Blood Culture ID Panel (Reflexed)     Status: Abnormal   Collection Time: 06/05/17  5:30 PM  Result Value Ref Range Status   Enterococcus species NOT DETECTED NOT DETECTED Final   Listeria  monocytogenes NOT DETECTED NOT DETECTED Final   Staphylococcus species DETECTED (A) NOT DETECTED Final    Comment: CRITICAL RESULT CALLED TO, READ BACK BY AND VERIFIED WITH: N. Glogovac Pharm.D. 11:20 06/06/17 ([email or web address])    Staphylococcus aureus DETECTED (A) NOT DETECTED Final    Comment: Methicillin (oxacillin) susceptible Staphylococcus aureus (MSSA). Preferred therapy is anti staphylococcal beta lactam antibiotic (Cefazolin or Nafcillin), unless clinically contraindicated. CRITICAL RESULT CALLED TO, READ BACK BY AND VERIFIED WITH: N. Glogovac Pharm.D. 11:20 06/06/17 ([email or web address])    Methicillin resistance NOT DETECTED NOT DETECTED Final   Streptococcus species NOT DETECTED NOT DETECTED Final   Streptococcus agalactiae NOT DETECTED NOT DETECTED Final   Streptococcus pneumoniae NOT DETECTED NOT DETECTED Final   Streptococcus pyogenes NOT DETECTED NOT DETECTED Final   Acinetobacter baumannii NOT DETECTED NOT DETECTED Final   Enterobacteriaceae species NOT DETECTED NOT DETECTED Final   Enterobacter cloacae complex NOT DETECTED NOT DETECTED Final   Escherichia coli NOT DETECTED NOT DETECTED Final   Klebsiella oxytoca NOT DETECTED NOT DETECTED Final   Klebsiella pneumoniae NOT DETECTED NOT DETECTED Final   Proteus species NOT DETECTED NOT DETECTED Final   Serratia marcescens NOT DETECTED NOT DETECTED Final   Haemophilus influenzae NOT DETECTED NOT DETECTED Final   Neisseria meningitidis NOT DETECTED NOT DETECTED Final   Pseudomonas aeruginosa NOT DETECTED NOT DETECTED Final   Candida albicans NOT DETECTED NOT DETECTED Final   Candida  glabrata NOT DETECTED NOT DETECTED Final   Candida krusei NOT DETECTED NOT DETECTED Final   Candida parapsilosis NOT DETECTED NOT DETECTED Final   Candida tropicalis NOT DETECTED NOT DETECTED Final    Comment: Performed at [facility], [address] [place], [place] [address]  Urine culture     Status: None   Collection Time: 06/05/17  5:49 PM  Result  Value Ref Range Status   Specimen Description URINE, CLEAN CATCH  Final   Special Requests NONE  Final   Culture   Final    NO GROWTH Performed at [facility], [address] [place], [place] [address]    Report Status 06/07/2017 FINAL  Final  MRSA PCR Screening     Status: None   Collection Time: 06/05/17  9:15 PM  Result Value Ref Range Status   MRSA by PCR NEGATIVE NEGATIVE Final    Comment:        The GeneXpert MRSA Assay (FDA approved for NASAL specimens only), is one component of a comprehensive MRSA colonization surveillance program. It is not intended to diagnose MRSA infection nor to guide or monitor treatment for MRSA infections.   Culture, blood (Routine X 2) w Reflex to ID Panel     Status: None (Preliminary result)   Collection Time: 06/06/17 12:36 PM  Result Value Ref Range Status   Specimen Description BLOOD RIGHT HAND  Final   Special Requests   Final    BOTTLES DRAWN AEROBIC AND ANAEROBIC Blood Culture adequate volume   Culture   Final    NO GROWTH 3 DAYS Performed at [facility], [address] [place], [place] [address]    Report Status PENDING  Incomplete  Culture, blood (Routine X 2) w Reflex to ID Panel     Status: None (Preliminary result)   Collection Time: 06/06/17 12:37 PM  Result Value Ref Range Status   Specimen Description BLOOD LEFT ANTECUBITAL  Final   Special Requests   Final    BOTTLES DRAWN AEROBIC AND ANAEROBIC Blood Culture adequate volume   Culture   Final    NO GROWTH 3 DAYS Performed at [facility], [address] [place], [place] [address]    Report Status PENDING  Incomplete      SIGNED:   Glade LloydKshitiz Gracieann Stannard, MD  [facility] 06/09/2017, 1:41 PM Pager: [phone]  If 7PM-7AM, please contact night-coverage [email or web address] Password [email or web address]

## 2017-06-11 LAB — CULTURE, BLOOD (ROUTINE X 2)
CULTURE: NO GROWTH
CULTURE: NO GROWTH
Special Requests: ADEQUATE
Special Requests: ADEQUATE

## 2017-06-13 LAB — HEPATITIS B SURFACE ANTIGEN

## 2017-06-13 LAB — HEPATITIS B SURFACE AG, CONFIRM: HBsAg Confirmation: POSITIVE — AB

## 2019-02-05 IMAGING — CT CT HEAD W/O CM
3 series · 14 of 47 positions shown, 16 images · non-contrast
Comparison: Report prior study January 18, 2007 available. Images from
that study not available.

CLINICAL DATA: Chronic heroin use with headache and altered mental
status

EXAM:
CT HEAD WITHOUT CONTRAST
TECHNIQUE: Contiguous axial images were obtained from the base of the skull
through the vertex without intravenous contrast.

[Series 2: head wo · axial · 0.47mm/px · z∈[-163,-38]mm · 8 of 31 slices shown, 10 images]
[im 3/31  brain]
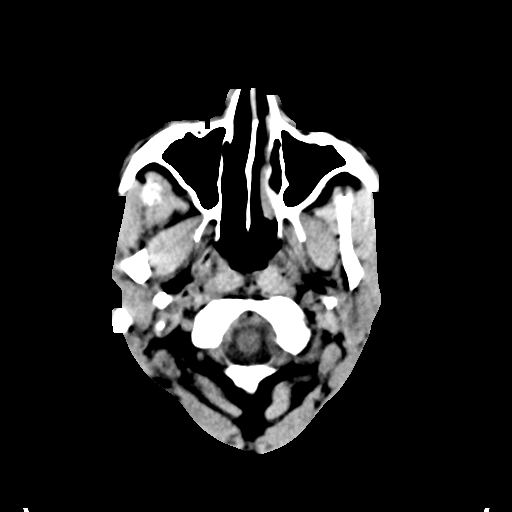
[im 3/31  bone]
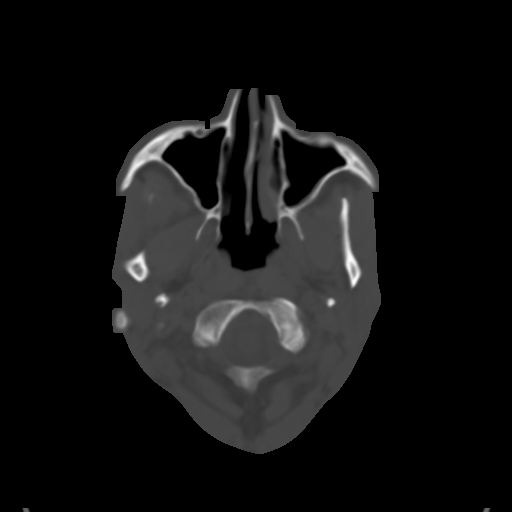
[im 7/31  brain]
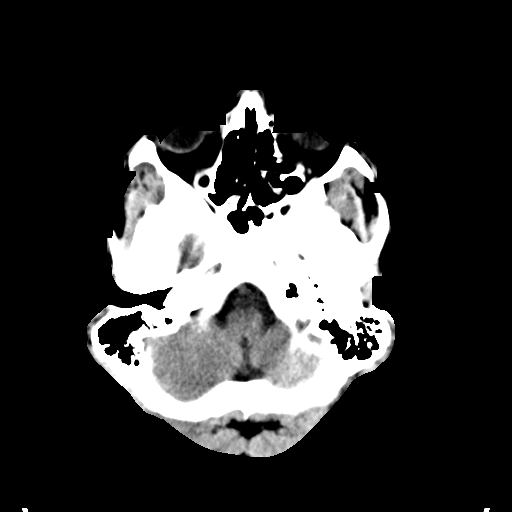
[im 10/31  brain]
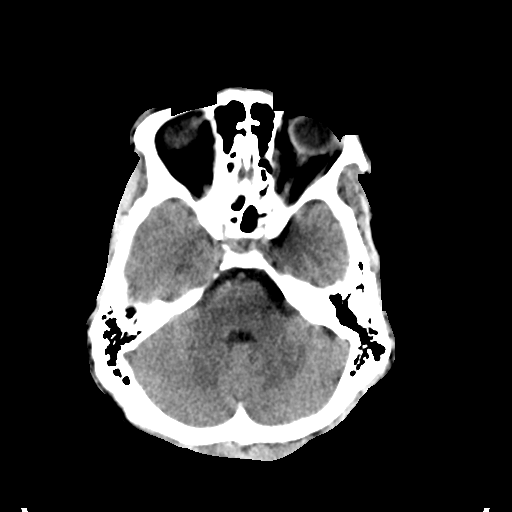
[im 14/31  brain]
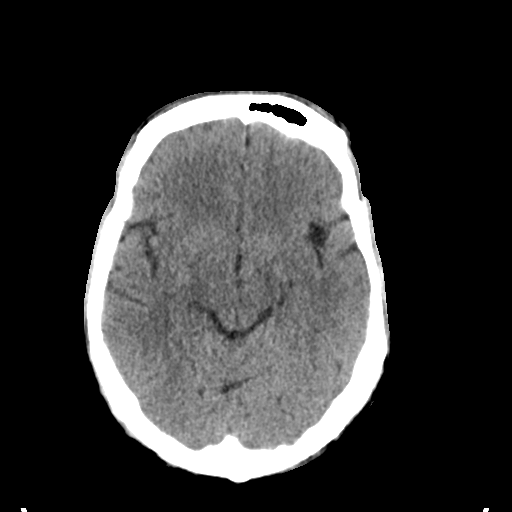
[im 17/31  brain]
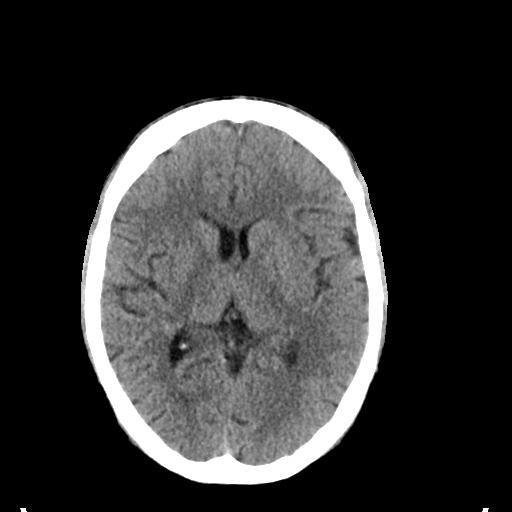
[im 17/31  bone]
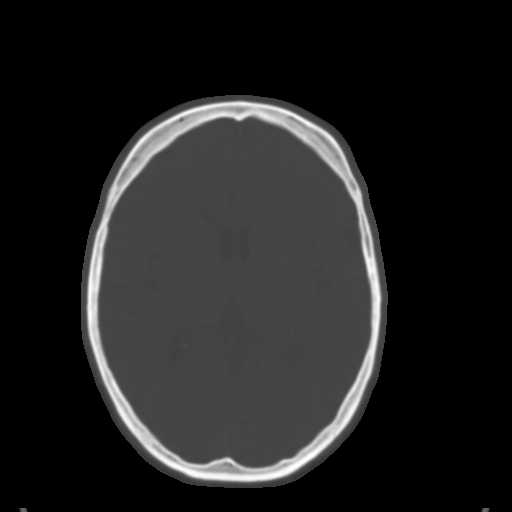
[im 21/31  brain]
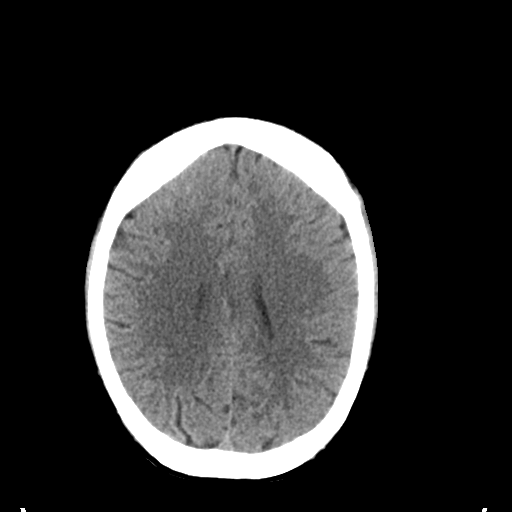
[im 24/31  brain]
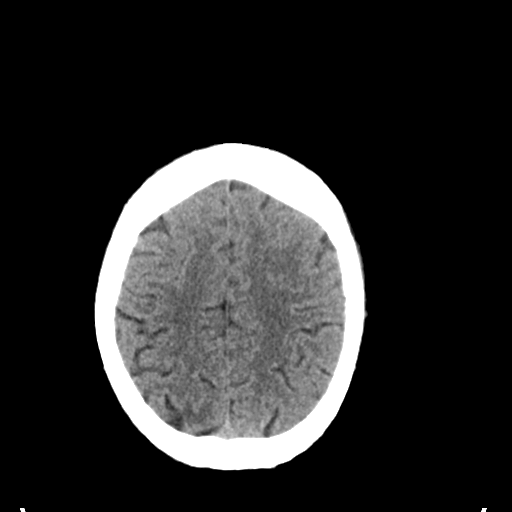
[im 28/31  brain]
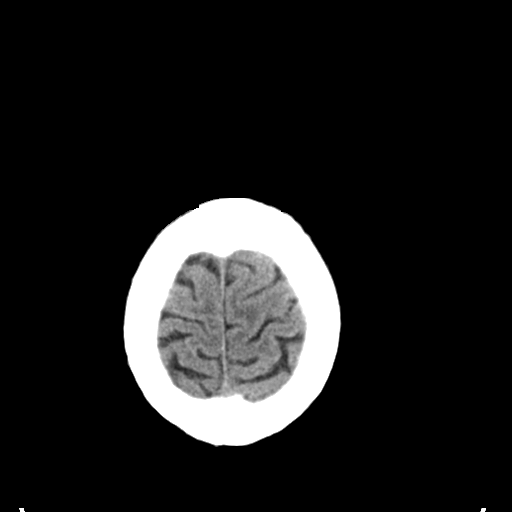

[Series 4: coronal soft tissue · coronal · 0.30mm/px · 3 of 69 slices shown]
[im 23/69  brain]
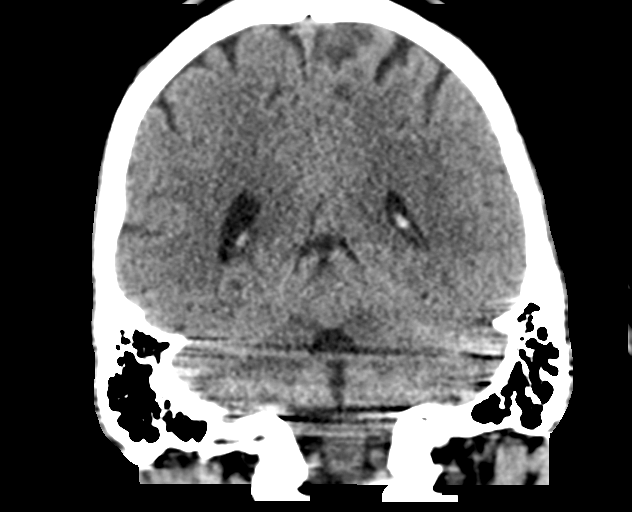
[im 31/69  brain]
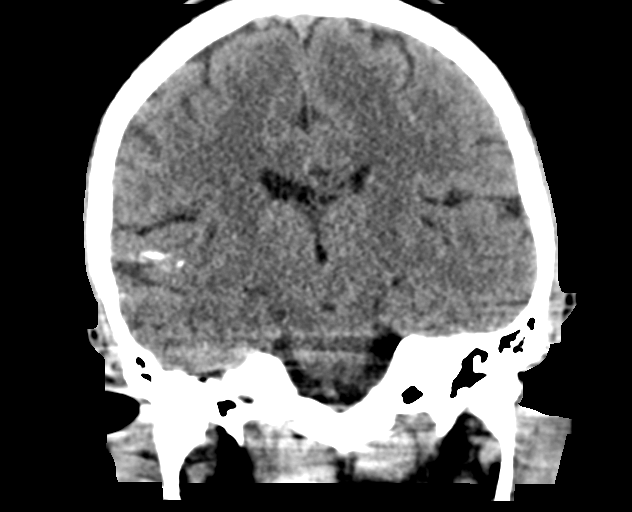
[im 38/69  brain]
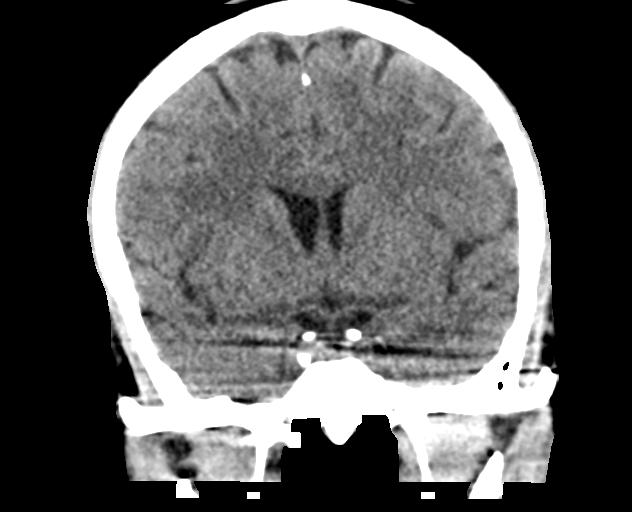

[Series 5: sagittal soft tissue · sagittal · 0.32mm/px · 3 of 56 slices shown]
[im 19/56  brain]
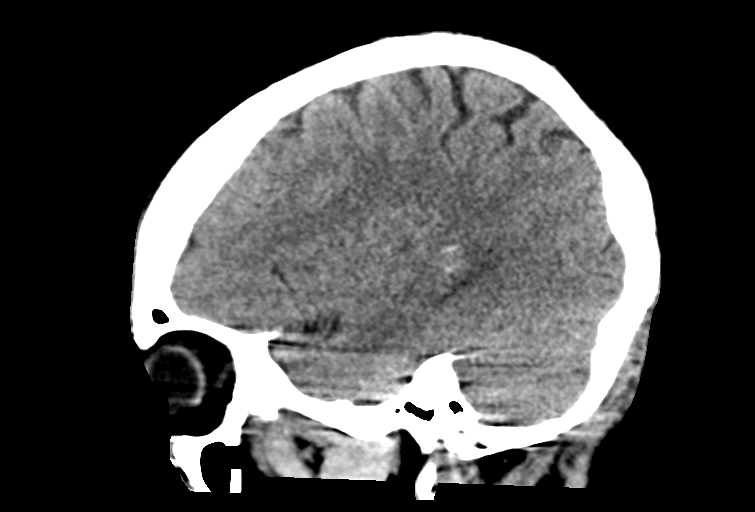
[im 28/56  brain]
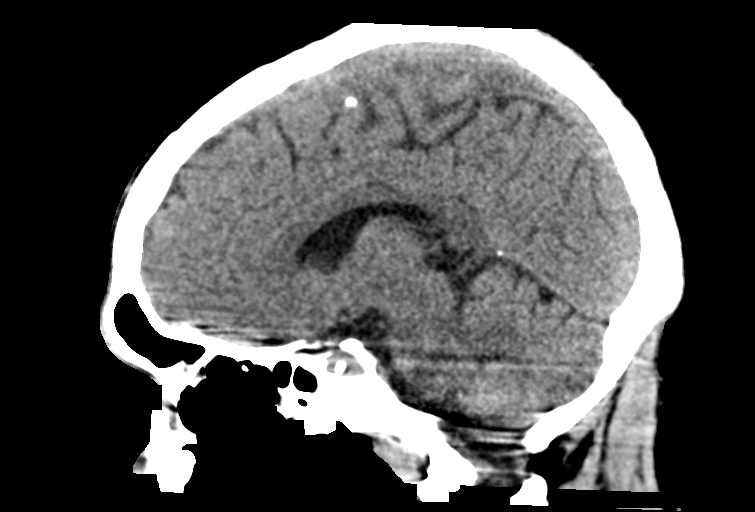
[im 37/56  brain]
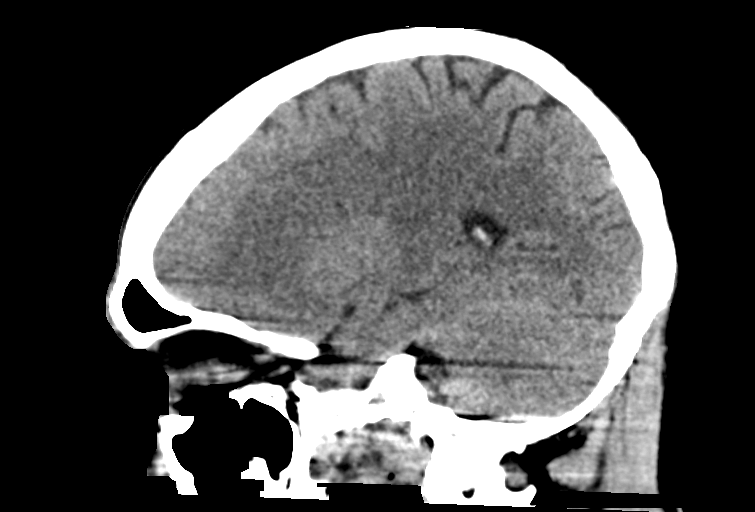

[14 of 47 positions shown; findings below may reference images not displayed]

FINDINGS: Brain: The ventricles are normal in size and configuration. The
right lateral ventricle is slightly larger than the left lateral
ventricle. Prior studies are not available for direct comparison to
confirm that this finding represents an anatomic variant.
Statistically, it most likely represents an anatomic variant.

There is no appreciable mass, acute hemorrhage, extra-axial fluid
collection, or midline shift. There is calcification in a somewhat
linear distribution in the superior right temporal lobe. No other
calcification noted of this nature elsewhere. Elsewhere gray-white
compartments appear normal. No evident acute infarct.

Vascular: No hyperdense vessel. No calcification in expected
vascular regions evident.

Skull: The bony calvarium appears intact.

Sinuses/Orbits: There is mucosal thickening in the left maxillary
antrum as well as in several ethmoid air cells. Other visualized
paranasal sinuses are clear. Orbits appear symmetric bilaterally.

Other: The mastoid air cells are clear.
IMPRESSION: 1. Calcification in a somewhat linear distribution in the right
temporal lobe. This calcification is of uncertain etiology. It may
have posttraumatic etiology. This calcification may also represent
residua from a vascular type malformation. Potentially MR with
intravenous contrast could be helpful for further delineation in
this area.

2. No acute hemorrhage evident. No mass or extra-axial fluid
collection. No acute infarct evident. Elsewhere gray-white
compartments appear normal.

3.  There are areas of paranasal sinus disease.

## 2021-12-31 NOTE — Congregational Nurse Program (Signed)
  Dept: [phone]   Congregational Nurse Program Not Date of Encounter: 12/28/2021  Past Medical History: Past Medical History:  Diagnosis Date   Dialysis care    no longer in dialysis   Hepatitis C antibody positive in blood    Heroin abuse (HCC)    Kidney stones    Polysubstance abuse (HCC)     Encounter Details:  CNP Questionnaire - 12/28/21 1100       Questionnaire   Do you give verbal consent to treat you today? Yes    Location Patient Served  [facility]    Visit Setting Church or Organization    Patient Status Homeless   waiting on [facility] to call when there is a bed ready   Insurance Unknown    Medication Have Medication Insecurities    Screening Referrals N/A    Medical Referral [facility]    Medical Appointment Made [facility]    Food Have Food Insecurities    Transportation Need transportation assistance   brother can take tp appointmeny   Housing/Utilities No permanent housing    Interpersonal Safety N/A    Intervention Blood pressure;Advocate;Counsel;Educate;Support    ED Visit Averted N/A    Life-Saving Intervention Made N/A            Patient came in today and asked for help with his depression that he has had all his life.  Patient denied any suicide ideations now however, feels now on the Suboxone something more his depression. Referred patient to [facility] walk in clinic Mon & Weds 0730-11am first come first serve, Patient agreed to follow up on Monday,  De Hollingshead, RN BSN CCNP Cell [phone]

## 2023-04-26 ENCOUNTER — Ambulatory Visit (HOSPITAL_COMMUNITY)
Admission: EM | Admit: 2023-04-26 | Discharge: 2023-04-26 | Disposition: A | Discharge status: Home / Self Care | Payer: No Payment, Other | Attending: Psychiatry | Admitting: Psychiatry

## 2023-04-26 DIAGNOSIS — F1910 Other psychoactive substance abuse, uncomplicated: Secondary | ICD-10-CM

## 2023-04-26 DIAGNOSIS — F1110 Opioid abuse, uncomplicated: Secondary | ICD-10-CM | POA: Insufficient documentation

## 2023-04-26 NOTE — ED Provider Notes (Addendum)
Behavioral Health Urgent Care Medical Screening Exam  Patient Name: David [name] MRN: [id number] Date of Evaluation: 04/26/23 Chief Complaint:   Diagnosis:  Final diagnoses:  Heroin abuse (HCC)  Polysubstance abuse (HCC)    History of Present illness: David [name] is a 42 y.o. male.  Presents to [facility] urgent care seeking detox services for heroin and methamphetamines.  States he last used this morning.  States he uses about half a gram daily.  Denies any other illicit substance to include cocaine, alcohol or marijuana.  He denied suicidal or homicidal ideations.  Denied auditory or visual hallucinations.    Sal reported that he was advised to follow-up by his friend " Abbie Jablon gave this provider verbal authorization to speak to David [name] regarding additional outpatient services. May follow-up daily for bed availability. David [name] stated that patient is currently seeking Suboxone treatment for detox services.  Patient reports he is currently followed by [facility] stop for Suboxone treatment.   David [name] is sitting in no acute distress.He is alert/oriented x 4; calm/cooperative; and mood congruent with affect. he is speaking in a clear tone at moderate volume, and normal pace; with good eye contact. His thought process is coherent and relevant; There is no indication that he is currently responding to internal/external stimuli or experiencing delusional thought content; and he has denied suicidal/self-harm/homicidal ideation, psychosis, and paranoia.   Patient has remained calm throughout assessment and has answered questions appropriately.     David [name] is educated and verbalizes understanding of mental health resources and other crisis services in the community.he is instructed to call 911 and present to the nearest emergency room should he experience any suicidal/homicidal ideation, auditory/visual/hallucinations, or detrimental worsening of her mental health condition.  He was a also advised by Clinical research associate that he could call the toll-free phone on insurance card to assist with identifying in network counselors and agencies or number on back of Medicaid card to speak with care coordinator.    Flowsheet Row ED from 04/26/2023 in [facility]  C-SSRS RISK CATEGORY No Risk       Psychiatric Specialty Exam  Presentation  General Appearance:Appropriate for Environment  Eye Contact:Good  Speech:Clear and Coherent  Speech Volume:Normal  Handedness:Right   Mood and Affect  Mood:Anxious; Depressed  Affect:Congruent   Thought Process  Thought Processes:Coherent  Descriptions of Associations:Intact  Orientation:Full (Time, Place and Person)  Thought Content:Logical    Hallucinations:None  Ideas of Reference:None  Suicidal Thoughts:No  Homicidal Thoughts:No   Sensorium  Memory:Immediate Fair; Recent Fair  Judgment:Fair  Insight:Fair   Executive Functions  Concentration:Fair  Attention Span:Fair  Recall:Good  Fund of Knowledge:Good  Language:Good   Psychomotor Activity  Psychomotor Activity:Normal   Assets  Assets:Desire for Improvement   Sleep  Sleep:Fair  Number of hours: No data recorded  Physical Exam: Physical Exam Vitals and nursing note reviewed.  Constitutional:      Appearance: Normal appearance.  Cardiovascular:     Rate and Rhythm: Normal rate and regular rhythm.  Neurological:     Mental Status: He is alert and oriented to person, place, and time.  Psychiatric:        Mood and Affect: Mood normal.        Behavior: Behavior normal.        Thought Content: Thought content normal.    Review of Systems  All other systems reviewed and are negative.  Blood pressure 124/76, pulse 83, resp. rate 18, SpO2 100%. There is no height  or weight on file to calculate BMI.  Musculoskeletal: Strength & Muscle Tone: within normal limits Gait & Station: normal Patient leans:  N/A   [facility] MSE Discharge Disposition for Follow up and Recommendations: Based on my evaluation the patient does not appear to have an emergency medical condition and can be discharged with resources and follow up care in outpatient services for Substance Abuse Intensive Outpatient Program   Oneta Rack, NP 04/26/2023, 2:01 PM

## 2023-04-26 NOTE — Discharge Instructions (Signed)
Take all medications as prescribed. Keep all follow-up appointments as scheduled.  Do not consume alcohol or use illegal drugs while on prescription medications. Report any adverse effects from your medications to your primary care provider promptly.  In the event of recurrent symptoms or worsening symptoms, call 911, a crisis hotline, or go to the nearest emergency department for evaluation.   

## 2023-04-26 NOTE — Progress Notes (Signed)
   04/26/23 1343  [facility] Triage Screening (Walk-ins at [facility] only)  How Did You Hear About [place]? Self  What Is the Reason for Your Visit/Call Today? Pt presents to [facility] voluntarily and unaccompanied seeking detox and substance use treatment. Pt reports using 2 grams of heroin daily, his last use was today 1/2 gram. Pt reports he is homeless.Pt denies SI/HI and AVH.  How Long Has This Been Causing You Problems? > than 6 months  Have You Recently Had Any Thoughts About Hurting Yourself? No  Are You Planning to Commit Suicide/Harm Yourself At This time? No  Have you Recently Had Thoughts About Hurting Someone Karolee Ohs? No  Are You Planning To Harm Someone At This Time? No  Are you currently experiencing any auditory, visual or other hallucinations? No  Have You Used Any Alcohol or Drugs in the Past 24 Hours? Yes  How long ago did you use Drugs or Alcohol? today  What Did You Use and How Much? 1/2 gram of heroin  Do you have any current medical co-morbidities that require immediate attention? No  Clinician description of patient physical appearance/behavior: calm, cooperative, unkempt  What Do You Feel Would Help You the Most Today? Alcohol or Drug Use Treatment  If access to [facility] was not available, would you have sought care in the Emergency Department? No  Determination of Need Routine (7 days)  Options For Referral Chemical Dependency Intensive Outpatient Therapy (CDIOP);Medication Management

## 2023-04-28 ENCOUNTER — Other Ambulatory Visit (HOSPITAL_COMMUNITY)
Admission: EM | Admit: 2023-04-28 | Discharge: 2023-05-01 | Disposition: A | Discharge status: Home / Self Care | Payer: MEDICAID | Attending: Psychiatry | Admitting: Psychiatry

## 2023-04-28 ENCOUNTER — Ambulatory Visit (HOSPITAL_COMMUNITY)
Admission: EM | Admit: 2023-04-28 | Discharge: 2023-04-28 | Disposition: A | Discharge status: Other Acute Inpatient Hospital | Payer: MEDICAID | Attending: Psychiatry | Admitting: Psychiatry

## 2023-04-28 DIAGNOSIS — F1510 Other stimulant abuse, uncomplicated: Secondary | ICD-10-CM | POA: Insufficient documentation

## 2023-04-28 DIAGNOSIS — F1520 Other stimulant dependence, uncomplicated: Secondary | ICD-10-CM

## 2023-04-28 DIAGNOSIS — B1920 Unspecified viral hepatitis C without hepatic coma: Secondary | ICD-10-CM | POA: Insufficient documentation

## 2023-04-28 DIAGNOSIS — F1193 Opioid use, unspecified with withdrawal: Secondary | ICD-10-CM

## 2023-04-28 DIAGNOSIS — Z5900 Homelessness unspecified: Secondary | ICD-10-CM | POA: Insufficient documentation

## 2023-04-28 DIAGNOSIS — F1910 Other psychoactive substance abuse, uncomplicated: Secondary | ICD-10-CM | POA: Diagnosis present

## 2023-04-28 DIAGNOSIS — F17200 Nicotine dependence, unspecified, uncomplicated: Secondary | ICD-10-CM | POA: Insufficient documentation

## 2023-04-28 DIAGNOSIS — Z8619 Personal history of other infectious and parasitic diseases: Secondary | ICD-10-CM | POA: Insufficient documentation

## 2023-04-28 DIAGNOSIS — F32A Depression, unspecified: Secondary | ICD-10-CM | POA: Insufficient documentation

## 2023-04-28 DIAGNOSIS — F1120 Opioid dependence, uncomplicated: Secondary | ICD-10-CM | POA: Diagnosis not present

## 2023-04-28 DIAGNOSIS — F1123 Opioid dependence with withdrawal: Secondary | ICD-10-CM | POA: Insufficient documentation

## 2023-04-28 LAB — CBC WITH DIFFERENTIAL/PLATELET
Abs Immature Granulocytes: 0.02 10*3/uL (ref 0.00–0.07)
Basophils Absolute: 0 10*3/uL (ref 0.0–0.1)
Basophils Relative: 1 %
Eosinophils Absolute: 0.1 10*3/uL (ref 0.0–0.5)
Eosinophils Relative: 3 %
HCT: 32.7 % — ABNORMAL LOW (ref 39.0–52.0)
Hemoglobin: 10.5 g/dL — ABNORMAL LOW (ref 13.0–17.0)
Immature Granulocytes: 0 %
Lymphocytes Relative: 20 %
Lymphs Abs: 1.1 10*3/uL (ref 0.7–4.0)
MCH: 27.6 pg (ref 26.0–34.0)
MCHC: 32.1 g/dL (ref 30.0–36.0)
MCV: 85.8 fL (ref 80.0–100.0)
Monocytes Absolute: 0.4 10*3/uL (ref 0.1–1.0)
Monocytes Relative: 8 %
Neutro Abs: 3.7 10*3/uL (ref 1.7–7.7)
Neutrophils Relative %: 68 %
Platelets: 264 10*3/uL (ref 150–400)
RBC: 3.81 MIL/uL — ABNORMAL LOW (ref 4.22–5.81)
RDW: 13.2 % (ref 11.5–15.5)
WBC: 5.4 10*3/uL (ref 4.0–10.5)
nRBC: 0 % (ref 0.0–0.2)

## 2023-04-28 LAB — COMPREHENSIVE METABOLIC PANEL
ALT: 16 U/L (ref 0–44)
AST: 28 U/L (ref 15–41)
Albumin: 4.3 g/dL (ref 3.5–5.0)
Alkaline Phosphatase: 111 U/L (ref 38–126)
Anion gap: 12 (ref 5–15)
BUN: 12 mg/dL (ref 6–20)
CO2: 23 mmol/L (ref 22–32)
Calcium: 9.7 mg/dL (ref 8.9–10.3)
Chloride: 101 mmol/L (ref 98–111)
Creatinine, Ser: 1.12 mg/dL (ref 0.61–1.24)
GFR, Estimated: >60 mL/min (ref >60)
Glucose, Bld: 175 mg/dL — ABNORMAL HIGH (ref 70–99)
Potassium: 4.3 mmol/L (ref 3.5–5.1)
Sodium: 136 mmol/L (ref 135–145)
Total Bilirubin: 0.5 mg/dL (ref 0.3–1.2)
Total Protein: 8.1 g/dL (ref 6.5–8.1)

## 2023-04-28 LAB — LIPID PANEL
Cholesterol: 119 mg/dL (ref 0–200)
HDL: 35 mg/dL — ABNORMAL LOW (ref >40)
LDL Cholesterol: 41 mg/dL (ref 0–99)
Total CHOL/HDL Ratio: 3.4 {ratio}
Triglycerides: 217 mg/dL — ABNORMAL HIGH (ref <150)
VLDL: 43 mg/dL — ABNORMAL HIGH (ref 0–40)

## 2023-04-28 LAB — POCT URINE DRUG SCREEN - MANUAL ENTRY (I-SCREEN)
POC Amphetamine UR: POSITIVE — AB
POC Buprenorphine (BUP): NOT DETECTED
POC Cocaine UR: NOT DETECTED — AB
POC Marijuana UR: NOT DETECTED
POC Methadone UR: NOT DETECTED
POC Methamphetamine UR: POSITIVE — AB
POC Morphine: NOT DETECTED
POC Oxazepam (BZO): NOT DETECTED
POC Oxycodone UR: NOT DETECTED
POC Secobarbital (BAR): NOT DETECTED

## 2023-04-28 LAB — HEMOGLOBIN A1C
Hgb A1c MFr Bld: 5.5 % (ref 4.8–5.6)
Mean Plasma Glucose: 111.15 mg/dL

## 2023-04-28 LAB — TSH: TSH: 0.46 u[IU]/mL (ref 0.350–4.500)

## 2023-04-28 LAB — ETHANOL: Alcohol, Ethyl (B): <10 mg/dL (ref <10)

## 2023-04-28 LAB — MAGNESIUM: Magnesium: 2.1 mg/dL (ref 1.7–2.4)

## 2023-04-28 MED ORDER — NAPROXEN 500 MG PO TABS: 500.0000 mg | ORAL_TABLET | Freq: Two times a day (BID) | ORAL | Status: DC | PRN

## 2023-04-28 MED ORDER — CLONIDINE HCL 0.1 MG PO TABS
0.1000 mg | ORAL_TABLET | Freq: Four times a day (QID) | ORAL | Status: DC
  Administered 2023-04-28: 0.1 mg via ORAL
  Filled 2023-04-28: qty 1

## 2023-04-28 MED ORDER — ACETAMINOPHEN 325 MG PO TABS: 650.0000 mg | ORAL_TABLET | Freq: Four times a day (QID) | ORAL | Status: DC | PRN

## 2023-04-28 MED ORDER — ALUM & MAG HYDROXIDE-SIMETH 200-200-20 MG/5ML PO SUSP: 30.0000 mL | ORAL | Status: DC | PRN

## 2023-04-28 MED ORDER — CLONIDINE HCL 0.1 MG PO TABS: 0.1000 mg | ORAL_TABLET | ORAL | Status: DC

## 2023-04-28 MED ORDER — HYDROXYZINE HCL 25 MG PO TABS: 25.0000 mg | ORAL_TABLET | Freq: Four times a day (QID) | ORAL | Status: DC | PRN

## 2023-04-28 MED ORDER — MAGNESIUM HYDROXIDE 400 MG/5ML PO SUSP: 30.0000 mL | Freq: Every day | ORAL | Status: DC | PRN

## 2023-04-28 MED ORDER — DICYCLOMINE HCL 20 MG PO TABS: 20.0000 mg | ORAL_TABLET | Freq: Four times a day (QID) | ORAL | Status: DC | PRN

## 2023-04-28 MED ORDER — CLONIDINE HCL 0.1 MG PO TABS: 0.1000 mg | ORAL_TABLET | Freq: Every day | ORAL | Status: DC

## 2023-04-28 MED ORDER — METHOCARBAMOL 500 MG PO TABS: 500.0000 mg | ORAL_TABLET | Freq: Three times a day (TID) | ORAL | Status: DC | PRN

## 2023-04-28 MED ORDER — TRAZODONE HCL 50 MG PO TABS: 50.0000 mg | ORAL_TABLET | Freq: Every evening | ORAL | Status: DC | PRN

## 2023-04-28 MED ORDER — ONDANSETRON 4 MG PO TBDP: 4.0000 mg | ORAL_TABLET | Freq: Four times a day (QID) | ORAL | Status: DC | PRN

## 2023-04-28 MED ORDER — LOPERAMIDE HCL 2 MG PO CAPS: 2.0000 mg | ORAL_CAPSULE | ORAL | Status: DC | PRN

## 2023-04-28 NOTE — Progress Notes (Signed)
   04/28/23 1720  [facility] Triage Screening (Walk-ins at [facility] only)  How Did You Hear About [place]?  ([facility])  What Is the Reason for Your Visit/Call Today? Mance Vallejo is a 42 year old male presenting to the [facility], referred by [facility]. The patient reports a primary complaint of wanting to "get clean." He has a daily heroin usage of up to 2 grams, with his last use occurring 30 minutes prior to arrival, administered intravenously. Additionally, he uses fentanyl daily, also up to 2 grams, but cannot recall his last use. Saba reports daily use of "ice," approximately a quarter of a gram, stating that he used "a couple of points" earlier today. He denies any alcohol use. The patient has a history of treatment at [facility] several times in previous years and attended [facility] last year. He denies suicidal ideations (SI), homicidal ideations (HI), and auditory/visual hallucinations (AVH). Kealan has a significant history of depressive symptoms beginning at the age of 37. His sleep patterns are poor, and his appetite is fair. He is not currently engaged with a therapist or psychiatrist and is not prescribed any psychiatric medications. Jarrah has been homeless for less than a year, is single, has four children, and is currently unemployed.  How Long Has This Been Causing You Problems? > than 6 months  Have You Recently Had Any Thoughts About Hurting Yourself? No  Are You Planning to Commit Suicide/Harm Yourself At This time? No  Have you Recently Had Thoughts About Hurting Someone Karolee Ohs? No  Are You Planning To Harm Someone At This Time? No  Are you currently experiencing any auditory, visual or other hallucinations? No  Have You Used Any Alcohol or Drugs in the Past 24 Hours? Yes  How long ago did you use Drugs or Alcohol? He reports daily heroin usage of up to 2 grams, with his last use occurring 30 minutes prior to arrival, administered intravenously. Additionally, he uses fentanyl daily, also up to 2  grams, but cannot recall his last use. Orey reports daily use of "ice," approximately a quarter of a gram, stating that he used "a couple of points" earlier today. He denies any alcohol use  What Did You Use and How Much? He reports daily heroin usage of up to 2 grams, with his last use occurring 30 minutes prior to arrival, administered intravenously. Additionally, he uses fentanyl daily, also up to 2 grams, but cannot recall his last use. Kaynen reports daily use of "ice," approximately a quarter of a gram, stating that he used "a couple of points" earlier today. He denies any alcohol use  Do you have any current medical co-morbidities that require immediate attention? No  Clinician description of patient physical appearance/behavior: Calm and cooperative. Unkept. Drowsy.  What Do You Feel Would Help You the Most Today? Treatment for Depression or other mood problem;Medication(s);Housing Assistance  If access to [facility] was not available, would you have sought care in the Emergency Department? Yes  Determination of Need Urgent (48 hours)  Options For Referral Medication Management;Facility-Based Crisis;Chemical Dependency Intensive Outpatient Therapy (CDIOP)

## 2023-04-28 NOTE — ED Notes (Signed)
Pt admitted to obs until labs are back then will go to [facility]. Denies SI/HI/AVH. Calm, cooperative throughout interview process. Skin assessment completed. Oriented to unit. Meal and drink offered. At currrent, pt continue to denySI/HI/AVH. Pt verbally contract for safety. Will monitor for safety.

## 2023-04-28 NOTE — ED Notes (Signed)
Pt is laying on recliner calm and cooperative he is eating a salad, but he is falling asleep then will wake up denies SI/HI/AVH pt is here for detox alert and orient x4 pt is pleasant will continue to monitor for safety

## 2023-04-28 NOTE — ED Provider Notes (Addendum)
Behavioral Health Urgent Care FBC/OBS H&P  Patient Name: David [name] MRN: [id number] Date of Evaluation: 04/28/23 Chief Complaint:  at the recommendation of [facility] unaccompanied requesting detox from heroin, fentanyl, methamphetamines, and xylazine. Diagnosis:  Final diagnoses:  Opioid use disorder, severe, dependence (HCC)  Methamphetamine abuse (HCC)    History of Present illness: David [name] is a 42 y.o. male patient presented to [facility] as a walk in at the recommendation of [facility] unaccompanied requesting detox from heroin, fentanyl, methamphetamines, and xylazine.  David [name], 42 y.o., male patient seen face to face by this provider and chart reviewed on 04/28/23.  Per chart review patient has a past psychiatric history for polysubstance abuse (heroin, alcohol, marijuana and opiates in the parentheses.  He has a medical history that includes hepatitis C.  On evaluation David [name] reports he presented to [facility] on 04/26/2023 at the recommendation [facility] but there was no bed availability in the [facility].  He presents today seeking bed availability to the [facility] for opiate detox.  Patient reports he began using substances roughly 30 years ago.  Since that time he has been a daily IV user.  He is using roughly 2 g of heroin, fentanyl, and xylazine daily.  Reports it is difficult to find heroin so most days he is using fentanyl, methamphetamines, and xylazine.  His last use was 30 minutes before arrival.  He is denying any withdrawal symptoms at this time.  He has received substance abuse treatment at [facility] and [facility] in the past.  He is currently homeless.  He identifies his motivating factor for treatment is to try to obtain custody of his 77-year-old daughter who was in the foster care system.  He is requesting to be started on Suboxone once he begins to have withdrawal symptoms.  Explained to patient that Suboxone is not generally prescribed in the [facility] but that would be up  to the [facility] provider.  Also discussed that is not recommended to start Suboxone until 36 hours after last use as to not precipitate withdrawal.  During evaluation David [name] is observed sitting in the assessment room in no acute distress.  He is disheveled and makes good eye contact.  He is alert/oriented x 4, cooperative, and attentive.  His pupils are pinpoint.  His speech is clear and coherent, and he is slow to respond.  He endorses depression with feelings of helplessness, hopelessness, decreased appetite, decreased sleep, and feelings of worthlessness.  He has a depressed affect.  He endorses anxiety and is fearful of going into withdrawal.  He denies suicidal/homicidal ideation.  He denies access to firearms/weapons.  He denies auditory/visual hallucinations.  He does not appear manic, psychotic, or paranoid.  He does not appear to be responding to internal/external stimuli.  Discussed admission to the [facility].  Explained the milieu and expectations.  For patient agreed to admission he asked, "is this a voluntary thing, cannot leave if I want to at any time".   Flowsheet Row ED from 04/28/2023 in [facility] ED from 04/26/2023 in [facility]  C-SSRS RISK CATEGORY No Risk No Risk       Psychiatric Specialty Exam  Presentation  General Appearance:Disheveled  Eye Contact:Good  Speech:Clear and Coherent; Slow  Speech Volume:Normal  Handedness:Right   Mood and Affect  Mood: Depressed; Anxious  Affect: Congruent   Thought Process  Thought Processes: Coherent  Descriptions of Associations:Intact  Orientation:Full (Time, Place and Person)  Thought Content:Logical    Hallucinations:None  Ideas of Reference:None  Suicidal Thoughts:No  Homicidal Thoughts:No   Sensorium  Memory: Immediate Good; Recent Good; Remote Good  Judgment: Fair  Insight: Fair   Art therapist   Concentration: Good  Attention Span: Good  Recall: Good  Fund of Knowledge: Good  Language: Good   Psychomotor Activity  Psychomotor Activity: Normal   Assets  Assets: Physical Health; Leisure Time; Resilience   Sleep  Sleep: Poor  Number of hours:  5   Physical Exam: Physical Exam Vitals and nursing note reviewed.  Constitutional:      Appearance: Normal appearance.  HENT:     Head: Normocephalic.     Right Ear: External ear normal.     Left Ear: External ear normal.  Cardiovascular:     Rate and Rhythm: Normal rate.  Pulmonary:     Effort: Pulmonary effort is normal. No respiratory distress.  Musculoskeletal:        General: Normal range of motion.     Cervical back: Normal range of motion.  Neurological:     Mental Status: He is alert and oriented to person, place, and time.    Review of Systems  Constitutional: Negative.   HENT: Negative.    Eyes: Negative.   Respiratory: Negative.    Cardiovascular: Negative.   Musculoskeletal: Negative.   Skin: Negative.   Neurological: Negative.   Psychiatric/Behavioral:  Positive for depression and substance abuse. The patient is nervous/anxious.    Blood pressure 132/88, pulse 93, temperature 98.4 F (36.9 C), temperature source Oral, resp. rate 18, SpO2 100%. There is no height or weight on file to calculate BMI.  Musculoskeletal: Strength & Muscle Tone: within normal limits Gait & Station: normal Patient leans: N/A   [facility] MSE Discharge Disposition for Follow up and Recommendations: Patient does not have an emergent situation.  Patient meets criteria for treatment in the facility based crisis unit.  Medications: COWS protocol with clonidine taper  Lab Orders         CBC with Differential/Platelet         Comprehensive metabolic panel         Hemoglobin A1c         Magnesium         Ethanol         Lipid panel         TSH         POCT Urine Drug Screen - (I-Screen)      EKG  Plan:  Patient is requesting opiate detox and is interested in residential substance abuse treatment.  Ardis Hughs, NP 04/28/2023, 5:50 PM

## 2023-04-28 NOTE — ED Notes (Signed)
Patient in milieu. Environment is secured. Will continue to monitor for safety. 
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
## 2023-04-28 NOTE — ED Notes (Signed)
Report given to [facility] on [facility]

## 2023-04-29 ENCOUNTER — Encounter (HOSPITAL_COMMUNITY): Payer: Self-pay | Admitting: Psychiatry

## 2023-04-29 DIAGNOSIS — Z5900 Homelessness unspecified: Secondary | ICD-10-CM | POA: Insufficient documentation

## 2023-04-29 DIAGNOSIS — F1123 Opioid dependence with withdrawal: Secondary | ICD-10-CM | POA: Diagnosis not present

## 2023-04-29 DIAGNOSIS — F17200 Nicotine dependence, unspecified, uncomplicated: Secondary | ICD-10-CM | POA: Insufficient documentation

## 2023-04-29 MED ORDER — ACETAMINOPHEN 325 MG PO TABS: 650.0000 mg | ORAL_TABLET | Freq: Four times a day (QID) | ORAL | Status: DC | PRN

## 2023-04-29 MED ORDER — ALUM & MAG HYDROXIDE-SIMETH 200-200-20 MG/5ML PO SUSP: 30.0000 mL | ORAL | Status: DC | PRN

## 2023-04-29 MED ORDER — CLONIDINE HCL 0.1 MG PO TABS
0.1000 mg | ORAL_TABLET | Freq: Four times a day (QID) | ORAL | Status: AC
  Administered 2023-04-29 – 2023-04-30 (×8): 0.1 mg via ORAL
  Filled 2023-04-29 (×8): qty 1

## 2023-04-29 MED ORDER — ONDANSETRON 4 MG PO TBDP
4.0000 mg | ORAL_TABLET | Freq: Four times a day (QID) | ORAL | Status: DC | PRN
  Administered 2023-04-29 – 2023-04-30 (×5): 4 mg via ORAL
  Filled 2023-04-29 (×5): qty 1

## 2023-04-29 MED ORDER — LOPERAMIDE HCL 2 MG PO CAPS: 2.0000 mg | ORAL_CAPSULE | ORAL | Status: DC | PRN

## 2023-04-29 MED ORDER — TAB-A-VITE/IRON PO TABS
1.0000 | ORAL_TABLET | Freq: Every day | ORAL | Status: DC
  Administered 2023-04-29 – 2023-05-01 (×3): 1 via ORAL
  Filled 2023-04-29 (×3): qty 1

## 2023-04-29 MED ORDER — MAGNESIUM HYDROXIDE 400 MG/5ML PO SUSP: 30.0000 mL | Freq: Every day | ORAL | Status: DC | PRN

## 2023-04-29 MED ORDER — HYDROXYZINE HCL 25 MG PO TABS
25.0000 mg | ORAL_TABLET | Freq: Four times a day (QID) | ORAL | Status: DC | PRN
  Administered 2023-04-30 – 2023-05-01 (×3): 25 mg via ORAL
  Filled 2023-04-29 (×3): qty 1

## 2023-04-29 MED ORDER — METHOCARBAMOL 500 MG PO TABS
500.0000 mg | ORAL_TABLET | Freq: Three times a day (TID) | ORAL | Status: DC | PRN
  Administered 2023-04-29 – 2023-05-01 (×3): 500 mg via ORAL
  Filled 2023-04-29 (×3): qty 1

## 2023-04-29 MED ORDER — CLONIDINE HCL 0.1 MG PO TABS: 0.1000 mg | ORAL_TABLET | Freq: Every day | ORAL | Status: DC

## 2023-04-29 MED ORDER — NICOTINE 21 MG/24HR TD PT24: 21.0000 mg | MEDICATED_PATCH | Freq: Every day | TRANSDERMAL | Status: DC | PRN

## 2023-04-29 MED ORDER — NAPROXEN 500 MG PO TABS
500.0000 mg | ORAL_TABLET | Freq: Two times a day (BID) | ORAL | Status: DC | PRN
  Administered 2023-04-29 – 2023-05-01 (×3): 500 mg via ORAL
  Filled 2023-04-29 (×3): qty 1

## 2023-04-29 MED ORDER — CLONIDINE HCL 0.1 MG PO TABS
0.1000 mg | ORAL_TABLET | ORAL | Status: DC
  Administered 2023-05-01: 0.1 mg via ORAL
  Filled 2023-04-29: qty 1

## 2023-04-29 MED ORDER — DICYCLOMINE HCL 20 MG PO TABS
20.0000 mg | ORAL_TABLET | Freq: Four times a day (QID) | ORAL | Status: DC | PRN
  Administered 2023-04-30: 20 mg via ORAL
  Filled 2023-04-29: qty 1
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
## 2023-04-29 NOTE — ED Notes (Signed)
Patient observed/assessed at bedside lying in bed asleep. Patient alert and oriented to self and location. Affect is flat and patient seems agitated to be disturbed from sleeping.  He denies A/V/H. He denies having any thoughts/plan of self harm and harm towards others. Pt rejects fluid and snack offered. Patient states he just wants to sleep. Verbalizes no further complaints at this time. Will continue to monitor and support.

## 2023-04-29 NOTE — ED Notes (Signed)
New pt admitted through [facility] to [facility] requesting for detox from [facility] and fentanyl. On arrival, A/O X4 MAE. Calm and cooperative during the admission process. Patient oriented to the unit and unit rules. Verbalized understanding. Skin assessment check was unremarkable. NAD. Denies SI/HI & AVH. Will continue to monitor for safety.

## 2023-04-29 NOTE — Tx Team (Signed)
LCSW and Resident met with patient to assess current mood, affect, physical state, and inquire about needs/goals while here in [facility] and after discharge. Patient reports he presented due to being told to come in by [employer]. Patient reports needing to detox from heroin and meth use in order to get his life back on track. Patient reports his only motivation as this time is him obtaining custody of his one year old daughter who was just out in foster care. Patient reports having an upcoming court date regarding this on May 21, 2023. Patient reports he has a Clinical research associate and would need to follow up with [facility] to see how they are able to assist him with his court follow up. Patient reports IV drug use of heroin and fentanyl for the past 12 years. Patient reports he uses up to 2 grams a day. Patient also reported on admission using a quarter of ICE/meth with last use prior to admission. Patient denies any other drug use or alcohol use. Patient reports he is currently homeless and has been for the last 6 months. Patient reports prior to that he was living with family that he no longer speaks to. Patient reports he is unsure what the plan is at this time. Patient reports he spoke to [place] [facility] [place] and they are trying to get him into [facility]. Patient aware that LCSW will send clinicals over for their review and will follow up to provide updates. Patient reports he has been to [facility] and [facility] multiple times in the past and reports he does not want to be considered for those agencies anymore. Patient reports receiving treatment from [facility] about a year ago and reports it was paid for by [facility]. Patient reports he would consider going back there if [facility] would support. Patient aware that LCSW will coordinate with [facility] to arrange safe discharge plan for the patient. No other needs were reported at this time by patient. LCSW will follow up with updates once received.   LCSW will continue  to follow and provide support to patient while on [facility] unit.   Fernande Boyden, LCSW Clinical Social Worker [facility] Ph: [phone]

## 2023-04-29 NOTE — Group Note (Signed)
Group Topic: Communication  Group Date: 04/29/2023 Start Time: 1715 End Time: 1812 Facilitators: Vonzell Schlatter B  Department: [facility]  Number of Participants: 3  Group Focus: communication and daily focus Treatment Modality:  Psychoeducation Interventions utilized were mental fitness and support Purpose: relapse prevention strategies and trigger / craving management  Name: Jaquin Coy Date of Birth: 1981/07/08  MR: [id number]    Level of Participation: Did not attend group pt was not feeling well, withdraw symptoms Quality of Participation: N/A Interactions with others: n/a Mood/Affect: n/a Triggers (if applicable): n/a Cognition: n/a Progress: n/a Response: n/a Plan: follow-up needed  Patients Problems:  Patient Active Problem List   Diagnosis Date Noted   Cellulitis of left upper extremity    Passive suicidal ideations    Chronic viral hepatitis B without delta agent and without coma (HCC)    Chronic hepatitis C without hepatic coma (HCC)    Staphylococcus aureus bacteremia with sepsis (HCC)    Sepsis (HCC) 06/05/2017   Pain and swelling of right ankle    Tobacco abuse    Pyogenic arthritis of ankle (HCC)    Polysubstance abuse (HCC) 12/06/2016   Synovitis of right ankle 12/06/2016   Heroin abuse (HCC) 09/07/2013

## 2023-04-29 NOTE — Progress Notes (Signed)
Pt is resting in this room and continue to exhibit mild withdrawal symptoms. No signs of acute distress noted. Pt declined dinner and Gatorade when offered. Staff will monitor for pt's safety.

## 2023-04-29 NOTE — ED Notes (Signed)
Patient observed/assessed in room in bed appearing in no immediate distress resting peacefully. Q15 minute checks continued by MHT and nursing staff. Will continue to monitor and support. 

## 2023-04-29 NOTE — Progress Notes (Addendum)
Pt's COWS was 13. Pt is exhibiting mild withdrawal symptoms. Pt reported emesis x1. No signs of acute distress noted. PRN Robaxin, Naprosyn, Zofran and scheduled meds administered per order. Staff will monitor for pt's safety.

## 2023-04-29 NOTE — Progress Notes (Signed)
Pt is awake, alert and oriented X3. Pt did not voice any complaints of pain or discomfort however complained of nausea. No signs of acute distress noted. PRN  Zofran and  scheduled med administered per order. Pt denies current SI/HI/AVH, plan or intent. Staff will monitor for pt's safety.

## 2023-04-29 NOTE — ED Notes (Signed)
Patient is in the bedroom sleeping.NAD.  Respirations are even and unlabored.  Will continue to monitor for safety.

## 2023-04-30 DIAGNOSIS — F17200 Nicotine dependence, unspecified, uncomplicated: Secondary | ICD-10-CM | POA: Diagnosis not present

## 2023-04-30 DIAGNOSIS — F1123 Opioid dependence with withdrawal: Secondary | ICD-10-CM | POA: Diagnosis not present

## 2023-04-30 DIAGNOSIS — Z5900 Homelessness unspecified: Secondary | ICD-10-CM | POA: Diagnosis not present

## 2023-04-30 MED ORDER — BUPRENORPHINE HCL-NALOXONE HCL 8-2 MG SL SUBL
1.0000 | SUBLINGUAL_TABLET | Freq: Two times a day (BID) | SUBLINGUAL | Status: DC
  Administered 2023-04-30 – 2023-05-01 (×2): 1 via SUBLINGUAL
  Filled 2023-04-30 (×2): qty 1

## 2023-04-30 NOTE — ED Notes (Signed)
Attempted X 2 to draw labs for lipid panel. Unsuccessful.

## 2023-04-30 NOTE — ED Notes (Signed)
Patient observed resting quietly, eyes closed. Respirations equal and unlabored. Will continue to monitor for safety.  

## 2023-04-30 NOTE — ED Notes (Signed)
Patient seen resting quietly on his bed. Large amount of unmeasured vomit on the floor. Denies SI/HI & AVH. Looks weak due to the vomit, Gatorade provided. Will continue to monitor for safety.

## 2023-04-30 NOTE — ED Provider Notes (Signed)
Behavioral Health Progress Note  Date and Time: 04/30/2023 6:28 PM Name: David [name] MRN:  [id number]  Reason for admission: David [name] is a 42 yo male with an extensive hx of IV drug use with opioid use disorder, stimulant use disorder (methamphetamines), and tobacco use disorder who was admitted to [facility] on 04/29/2023 seeking opioid detox and interested in residential rehabilitation. He is followed by [facility] for suboxone treatment. He has no significant past psychiatric history. UDS is positive for amphetamines, cocaine, and methamphetamines. PMHx is significant for Hep B and Hep C infections.    Subjective:   Patient evaluated in his room, laying in his bed. Cravings are present. He reports he does not feel well this morning, has been having ongoing withdrawals and reports they have worsened. He reports 3 episodes of vomiting since last night. He has ongoing nausea, chills, myalgias, abdominal pain, and diarrhea. Encouraged patient to maintain adequate fluid intake. RN notified to provide pt with Gatorade.   Attempted to contact patient's representative and Librarian, academic at [employer], at [phone] this morning. Left a HIPAA compliant voicemail.  I was able to meet with Terie Purser in person later in the afternoon.  He shares the patient has been receiving follow-up with the [facility] for the past 3 years.  Reports the patient has been unhoused throughout that time and has been on a housing list.  Casimiro Needle is able to confirm that patient has a pending court case related to CPS removing his 81-year-old daughter from his custody due to housing instability, which Casimiro Needle believes is a primary stressor.  Prior to admission to [facility], patient had been receiving Suboxone 8-2 mg 4 times daily, was last seen by Dr. Lorri Frederick in 9/23.   Diagnosis:  Final diagnoses:  Opioid use with withdrawal (HCC)  Tobacco use disorder  Methamphetamine use disorder, severe, dependence (HCC)     Total Time spent with patient: 30 minutes  Substance Use Hx: Alcohol:  Quit drinking 7 yrs ago, describes heavy alcohol, black out, unable to further characterize frequency/amount or hx of abuse. No seizures related to alcohol withdrawal. Tobacco: Cigarettes 1 pd daily, since he was a "kid" Cannabis:  Off-and-on use unable to specify frequency or hx of abuse.  Other Illicit drugs:   Methamphetamines; Last use prior to admission: 10/7 day before admitting Onset of use: has been using for past 8 years Route: IV Frequency/Amount of Use: daily, unsure of amount Hx of withdrawals: Periods of sobriety:    Opids/Fentanyl/Xylozyne/Heroin: Last use prior to admission: right before admission Onset of use: 12 years Route: IV drug use for past 12 years W/ hx of hepatitis C (was treated) Frequency/Amount of Use: a couple of grams daily   Past Psychiatric Hx: Current Psychiatrist: None recently Current Therapist: None recently Previous Psychiatric Diagnoses: unknown to pt, reports having some dx as a child Current psychiatric medications:denies Psychiatric medication history/compliance: pt is unsure Psychiatric Hospitalization hx:  Hospitalized at age 36 yo due to HI towards mother Psychotherapy hx: No Neuromodulation history: No History of suicide: Denies History of homicide or aggression: Denies     Past Medical History: PCP: No Medical Dx: Unknown to pt, dneies any Medications: denies Allergies: denies Hospitalizations: reports hospitlization for [facility] in past, no recent hospitlizations Surgeries: denies Trauma: denies Seizures: denies     Family Medical History: Denies   Family Psychiatric History: Psychiatric Dx: per patient, mother has schizophrenia, maternal uncle have schizophrenia Suicide Hx: Denies Violence/Aggression: Denies Substance DGL:OVFIEP, mother, aunts  are all substance abusers.    Social History: Living Situation: Homeless for last 6  months Social Support: Does not report specific social support Education: HS Occupational hx: unemployed for years Marital Status: Single Children: has 4 children, 55 yo, 19yo, 62 yo and 1yo. 1 yo daughter got taken form CPS. Legal: Oct 30th court appointment Military: No    Access to firearms: Denies   Current Medications:  Current Facility-Administered Medications  Medication Dose Route Frequency Provider Last Rate Last Admin   alum & mag hydroxide-simeth (MAALOX/MYLANTA) 200-200-20 MG/5ML suspension 30 mL  30 mL Oral Q4H PRN Onuoha, Chinwendu V, NP       buprenorphine-naloxone (SUBOXONE) 8-2 mg per SL tablet 1 tablet  1 tablet Sublingual BID Carrion-Carrero, Derelle Cockrell, MD       cloNIDine (CATAPRES) tablet 0.1 mg  0.1 mg Oral QID Onuoha, Chinwendu V, NP   0.1 mg at 04/30/23 1758   Followed by   Melene Muller ON 05/01/2023] cloNIDine (CATAPRES) tablet 0.1 mg  0.1 mg Oral BH-qamhs Onuoha, Chinwendu V, NP       Followed by   Melene Muller ON 05/03/2023] cloNIDine (CATAPRES) tablet 0.1 mg  0.1 mg Oral QAC breakfast Onuoha, Chinwendu V, NP       dicyclomine (BENTYL) tablet 20 mg  20 mg Oral Q6H PRN Onuoha, Chinwendu V, NP   20 mg at 04/30/23 0221   hydrOXYzine (ATARAX) tablet 25 mg  25 mg Oral Q6H PRN Onuoha, Chinwendu V, NP   25 mg at 04/30/23 0221   loperamide (IMODIUM) capsule 2-4 mg  2-4 mg Oral PRN Onuoha, Chinwendu V, NP       magnesium hydroxide (MILK OF MAGNESIA) suspension 30 mL  30 mL Oral Daily PRN Onuoha, Chinwendu V, NP       methocarbamol (ROBAXIN) tablet 500 mg  500 mg Oral Q8H PRN Onuoha, Chinwendu V, NP   500 mg at 04/30/23 0221   multivitamins with iron tablet 1 tablet  1 tablet Oral Daily Mariel Craft, MD   1 tablet at 04/30/23 0930   naproxen (NAPROSYN) tablet 500 mg  500 mg Oral BID PRN Onuoha, Chinwendu V, NP   500 mg at 04/30/23 1758   nicotine (NICODERM CQ - dosed in mg/24 hours) patch 21 mg  21 mg Transdermal Daily PRN Carrion-Carrero, Amaryllis Malmquist, MD       ondansetron (ZOFRAN-ODT)  disintegrating tablet 4 mg  4 mg Oral Q6H PRN Onuoha, Chinwendu V, NP   4 mg at 04/30/23 1228   Current Outpatient Medications  Medication Sig Dispense Refill   Buprenorphine HCl-Naloxone HCl 8-2 MG FILM Place inside cheek QID.      Labs  Lab Results:  Admission on 04/28/2023, Discharged on 04/28/2023  Component Date Value Ref Range Status   WBC 04/28/2023 5.4  4.0 - 10.5 K/uL Final   RBC 04/28/2023 3.81 (L)  4.22 - 5.81 MIL/uL Final   Hemoglobin 04/28/2023 10.5 (L)  13.0 - 17.0 g/dL Final   HCT 40/98/1191 32.7 (L)  39.0 - 52.0 % Final   MCV 04/28/2023 85.8  80.0 - 100.0 fL Final   MCH 04/28/2023 27.6  26.0 - 34.0 pg Final   MCHC 04/28/2023 32.1  30.0 - 36.0 g/dL Final   RDW 47/82/9562 13.2  11.5 - 15.5 % Final   Platelets 04/28/2023 264  150 - 400 K/uL Final   nRBC 04/28/2023 0.0  0.0 - 0.2 % Final   Neutrophils Relative % 04/28/2023 68  % Final   Neutro Abs  04/28/2023 3.7  1.7 - 7.7 K/uL Final   Lymphocytes Relative 04/28/2023 20  % Final   Lymphs Abs 04/28/2023 1.1  0.7 - 4.0 K/uL Final   Monocytes Relative 04/28/2023 8  % Final   Monocytes Absolute 04/28/2023 0.4  0.1 - 1.0 K/uL Final   Eosinophils Relative 04/28/2023 3  % Final   Eosinophils Absolute 04/28/2023 0.1  0.0 - 0.5 K/uL Final   Basophils Relative 04/28/2023 1  % Final   Basophils Absolute 04/28/2023 0.0  0.0 - 0.1 K/uL Final   Immature Granulocytes 04/28/2023 0  % Final   Abs Immature Granulocytes 04/28/2023 0.02  0.00 - 0.07 K/uL Final   Performed at [facility], [address], [place], [place] [address]   Sodium 04/28/2023 136  135 - 145 mmol/L Final   Potassium 04/28/2023 4.3  3.5 - 5.1 mmol/L Final   Chloride 04/28/2023 101  98 - 111 mmol/L Final   CO2 04/28/2023 23  22 - 32 mmol/L Final   Glucose, Bld 04/28/2023 175 (H)  70 - 99 mg/dL Final   Glucose reference range applies only to samples taken after fasting for at least 8 hours.   BUN 04/28/2023 12  6 - 20 mg/dL Final   Creatinine, Ser  04/28/2023 1.12  0.61 - 1.24 mg/dL Final   Calcium 60/45/4098 9.7  8.9 - 10.3 mg/dL Final   Total Protein 11/91/4782 8.1  6.5 - 8.1 g/dL Final   Albumin 95/62/1308 4.3  3.5 - 5.0 g/dL Final   AST 65/78/4696 28  15 - 41 U/L Final   ALT 04/28/2023 16  0 - 44 U/L Final   Alkaline Phosphatase 04/28/2023 111  38 - 126 U/L Final   Total Bilirubin 04/28/2023 0.5  0.3 - 1.2 mg/dL Final   GFR, Estimated 04/28/2023 >60  >60 mL/min Final   Comment: (NOTE) Calculated using the CKD-EPI Creatinine Equation (2021)    Anion gap 04/28/2023 12  5 - 15 Final   Performed at [facility], [address] [place], [place] [address]   Hgb A1c MFr Bld 04/28/2023 5.5  4.8 - 5.6 % Final   Comment: (NOTE) Pre diabetes:          5.7%-6.4%  Diabetes:              >6.4%  Glycemic control for   <7.0% adults with diabetes    Mean Plasma Glucose 04/28/2023 111.15  mg/dL Final   Performed at [facility], [address] [place], [place] [address]   Magnesium 04/28/2023 2.1  1.7 - 2.4 mg/dL Final   Performed at [facility], [address], [place], [place] [address]   Alcohol, Ethyl (B) 04/28/2023 <10  <10 mg/dL Final   Comment: (NOTE) Lowest detectable limit for serum alcohol is 10 mg/dL.  For medical purposes only. Performed at [facility], [address], [place], [place] [address]    Cholesterol 04/28/2023 119  0 - 200 mg/dL Final   Triglycerides 64/40/3474 217 (H)  <150 mg/dL Final   HDL 25/95/6387 35 (L)  >40 mg/dL Final   Total CHOL/HDL Ratio 04/28/2023 3.4  RATIO Final   VLDL 04/28/2023 43 (H)  0 - 40 mg/dL Final   LDL Cholesterol 04/28/2023 41  0 - 99 mg/dL Final   Comment:        Total Cholesterol/HDL:CHD Risk Coronary Heart Disease Risk Table  Men   Women  1/2 Average Risk   3.4   3.3  Average Risk       5.0   4.4  2 X Average Risk   9.6   7.1  3 X Average Risk  23.4   11.0        Use the calculated Patient Ratio above and the CHD Risk  Table to determine the patient's CHD Risk.        ATP III CLASSIFICATION (LDL):  <100     mg/dL   Optimal  161-096  mg/dL   Near or Above                    Optimal  130-159  mg/dL   Borderline  045-409  mg/dL   High  >811     mg/dL   Very High Performed at [facility], [address], [place], [place] [address]    TSH 04/28/2023 0.460  0.350 - 4.500 uIU/mL Final   Comment: Performed by a 3rd Generation assay with a functional sensitivity of <=0.01 uIU/mL. Performed at [facility], [address], [place], [place] [address]    POC Amphetamine UR 04/28/2023 Positive (A)  NONE DETECTED (Cut Off Level 1000 ng/mL) Final   POC Secobarbital (BAR) 04/28/2023 None Detected  NONE DETECTED (Cut Off Level 300 ng/mL) Final   POC Buprenorphine (BUP) 04/28/2023 None Detected  NONE DETECTED (Cut Off Level 10 ng/mL) Final   POC Oxazepam (BZO) 04/28/2023 None Detected  NONE DETECTED (Cut Off Level 300 ng/mL) Final   POC Cocaine UR 04/28/2023 None Detected (A)  NONE DETECTED (Cut Off Level 300 ng/mL) Final   POC Methamphetamine UR 04/28/2023 Positive (A)  NONE DETECTED (Cut Off Level 1000 ng/mL) Final   POC Morphine 04/28/2023 None Detected  NONE DETECTED (Cut Off Level 300 ng/mL) Final   POC Methadone UR 04/28/2023 None Detected  NONE DETECTED (Cut Off Level 300 ng/mL) Final   POC Oxycodone UR 04/28/2023 None Detected  NONE DETECTED (Cut Off Level 100 ng/mL) Final   POC Marijuana UR 04/28/2023 None Detected  NONE DETECTED (Cut Off Level 50 ng/mL) Final    Blood Alcohol level:  Lab Results  Component Value Date   ETH <10 04/28/2023   ETH  06/29/2007    <5        LOWEST DETECTABLE LIMIT FOR SERUM ALCOHOL IS 11 mg/dL FOR MEDICAL PURPOSES ONLY    Metabolic Disorder Labs: Lab Results  Component Value Date   HGBA1C 5.5 04/28/2023   MPG 111.15 04/28/2023   No results found for: "PROLACTIN" Lab Results  Component Value Date   CHOL 119 04/28/2023   TRIG 217 (H) 04/28/2023   HDL  35 (L) 04/28/2023   CHOLHDL 3.4 04/28/2023   VLDL 43 (H) 04/28/2023   LDLCALC 41 04/28/2023    Therapeutic Lab Levels: No results found for: "LITHIUM" No results found for: "VALPROATE" No results found for: "CBMZ"  Physical Findings   PHQ2-9    Flowsheet Row ED from 04/28/2023 in [facility]  PHQ-2 Total Score 2  PHQ-9 Total Score 11      Flowsheet Row ED from 04/28/2023 in [facility] Most recent reading at 04/28/2023 10:39 PM ED from 04/28/2023 in [facility] Most recent reading at 04/28/2023  6:42 PM ED from 04/26/2023 in [facility] Most recent reading at 04/26/2023  1:44 PM  C-SSRS RISK  CATEGORY No Risk No Risk No Risk        Musculoskeletal  Strength & Muscle Tone: within normal limits Gait & Station: normal Patient leans: N/A  Psychiatric Specialty Exam  Presentation  General Appearance:  Appropriate for Environment  Eye Contact: Good  Speech: Clear and Coherent; Normal Rate  Speech Volume: Normal  Handedness: -- (not assessed)   Mood and Affect  Mood: -- ("Wonderful")  Affect: Congruent; Full Range (appears brighter)   Thought Process  Thought Processes: Linear  Descriptions of Associations:Intact  Orientation:-- (not assessed)  Thought Content:Logical  Diagnosis of Schizophrenia or Schizoaffective disorder in past: No    Hallucinations:Hallucinations: None  Ideas of Reference:None  Suicidal Thoughts:Suicidal Thoughts: No  Homicidal Thoughts:Homicidal Thoughts: No   Sensorium  Memory: Immediate Good; Recent Good; Remote Good  Judgment: Fair  Insight: Fair   Chartered certified accountant: Fair  Attention Span: Fair  Recall: Fair  Fund of Knowledge: Fair  Language: Fair   Psychomotor Activity  Psychomotor Activity:Psychomotor Activity: Normal   Assets  Assets: Desire for Improvement;  Resilience; Communication Skills   Sleep  Sleep:Sleep: Good   Physical Exam  Physical Exam Vitals and nursing note reviewed.  Constitutional:      Appearance: He is ill-appearing and diaphoretic.  HENT:     Head: Normocephalic and atraumatic.  Pulmonary:     Effort: Pulmonary effort is normal. No respiratory distress.  Neurological:     General: No focal deficit present.    Review of Systems  Constitutional:  Positive for chills and malaise/fatigue.  Cardiovascular:  Negative for chest pain and palpitations.  Gastrointestinal:  Positive for abdominal pain, diarrhea and vomiting.  Musculoskeletal:  Positive for myalgias.  Neurological:  Positive for headaches.   Blood pressure 138/82, pulse 83, temperature 98.2 F (36.8 C), temperature source Oral, resp. rate 16, SpO2 100%. There is no height or weight on file to calculate BMI.  Treatment Plan Summary: Daily contact with patient to assess and evaluate symptoms and progress in treatment and Medication management  Long Term Goals: Improvement in symptoms so as ready for discharge   Short Term Goals: Patient will verbalize feelings in meetings with treatment team members., Patient will attend at least of 50% of the groups daily., Pt will complete the PHQ9 on admission, day 3 and discharge., Patient will participate in completing the [place] Suicide Severity Rating Scale, Patient will score a low risk of violence for 24 hours prior to discharge, and Patient will take medications as prescribed daily.   Medical Decision Making  Psychiatric Diagnoses: Opioid Use Disorder, severe, with withdrawal Tobacco Use Disorder (Stimulant) Methamphetamine Use Disorder, severe, dependence     Psychiatric Diagnoses and Treatment  #Opioid Use Disorder COWS Last COWS score is 11 on 04/30/2023 at 7:15 AM  Hx of IV Drug use  Hepatitis Panel ordered on 10/9 Start Suboxone 8-2 mg tablet twice daily, with first dose on 10/9 on 10 PM this  evening PRNs: Tylenol 650 mg every 6 hours as needed for mild pain Naproxen 500 mg BID as needed for pain Bentyl 20 mg every 6 hours as needed for spasms/abdominal cramping Robaxin 500 mg every 8 hours as needed for muscle spasms Zofran 4 mg every 6 hours as needed for nausea or vomiting Imodium 2 to 4 mg as needed for diarrhea or loose stools Maalox/Mylanta 30 mL every 4 hours as needed for indigestion Milk of Mag 30 mL as needed for constipation    #Tobacco Use Disorder Smoking cessation  encouraged Nicotine patch   Medical Issues Being Addressed   #Malnourishment Vit Deficiencies Ferritin level pending Vit D level pending     Labs/Imaging Reviewed: CBC showing normocytic anemia (Hg 10.5, MCV 85.8) CMP hyperglycemia (175) A1C 5.5% Lipid Panel: TGs 217, VLD 43 (non- fasting) TSH normal UDS + amphetamine, methamphetamine   EKG on 04/28/2023: QTc 410     Disposition: [facility] tomorrow    Recommendations  Based on my evaluation the patient does not appear to have an emergency medical condition.   Signed: Lorri Frederick, MD 04/30/2023 6:28 PM

## 2023-04-30 NOTE — Group Note (Signed)
Group Topic: Understanding Self  Group Date: 04/30/2023 Start Time: 1000 End Time: 1015 Facilitators: Priscille Kluver, NT  Department: [facility]  Number of Participants: 1  Group Focus: coping skills Treatment Modality:  Solution-Focused Therapy Interventions utilized were story telling Purpose: express feelings  Name: David [name] Date of Birth: 09-Jul-1981  MR: [id number]    Level of Participation: Pt did not attend group.   Patients Problems:  Patient Active Problem List   Diagnosis Date Noted   Cellulitis of left upper extremity    Passive suicidal ideations    Chronic viral hepatitis B without delta agent and without coma (HCC)    Chronic hepatitis C without hepatic coma (HCC)    Staphylococcus aureus bacteremia with sepsis (HCC)    Sepsis (HCC) 06/05/2017   Pain and swelling of right ankle    Tobacco abuse    Pyogenic arthritis of ankle (HCC)    Polysubstance abuse (HCC) 12/06/2016   Synovitis of right ankle 12/06/2016   Heroin abuse (HCC) 09/07/2013

## 2023-04-30 NOTE — ED Notes (Signed)
Patient was notably groaning loudly that was heard from the main nursing station when patient was in his room. Patient verbalized complaint of "withdrawing" stating that he had chills and felt as if he was going to vomit. Patient's VS obtained with some difficulty as patient displayed with constant movement of extremities and continued to attempt to lie back down in the fetal position. Patient verbalized complaint of pain but would not emphasize or verbalize a number for his pain. Patient continued to repeatedly state that he was coming off fentanyl. COWS-10. BP-131/66 HR-52. Will continue to monitor/support.

## 2023-04-30 NOTE — ED Notes (Signed)
Patient currently in his room. Patient has been restless all morning. When assessed, he denies pain, even though patient heard yelling out intermittently. He is alert and oriented X 4. He denies SI/HI or AVH. Patient able to contract for safety. Gatorade provided as patient has been vomiting. We will continue to monitor for safety.

## 2023-04-30 NOTE — Discharge Planning (Signed)
LCSW attempted to get in contact with [facility] regarding patient referral, however received no answer. LCSW left voice message at 8:31am requesting phone call back from Admission Coordinator Marleena.  LCSW received email from [facility] representative Roena Malady regarding the patient having a bed available at [facility], however the phone screening must be complete. LCSW will attempt to follow up with [facility] at a later time in order to complete request.   Fernande Boyden, LCSW Clinical Social Worker [place] Ph: [phone]

## 2023-04-30 NOTE — Discharge Planning (Signed)
LCSW received phone call back from [facility] regarding the patient. Per Lahoma Rocker, phone screening has been scheduled for tomorrow at 10:00am. LCSW will provide an update to the patient and MD.   Fernande Boyden, LCSW Clinical Social Worker [place] [facility] Ph: [phone]

## 2023-04-30 NOTE — Group Note (Signed)
Group Topic: Relapse and Recovery  Group Date: 04/30/2023 Start Time: 0730 End Time: 0800 Facilitators: Debe Coder, NT  Department: [facility]  Number of Participants: 1  Group Focus: self-awareness Treatment Modality:  Individual Therapy Interventions utilized were support Purpose: enhance coping skills  Name: David [name] Date of Birth: [birth year]  MR: [id number]    Level of Participation: unable to attend group Quality of Participation: unable to attend group Interactions with others:  Mood/Affect:  Triggers (if applicable):  Cognition:  Progress:  Response:  Plan:   Patients Problems:  Patient Active Problem List   Diagnosis Date Noted   Cellulitis of left upper extremity    Passive suicidal ideations    Chronic viral hepatitis B without delta agent and without coma (HCC)    Chronic hepatitis C without hepatic coma (HCC)    Staphylococcus aureus bacteremia with sepsis (HCC)    Sepsis (HCC) 06/05/2017   Pain and swelling of right ankle    Tobacco abuse    Pyogenic arthritis of ankle (HCC)    Polysubstance abuse (HCC) 12/06/2016   Synovitis of right ankle 12/06/2016   Heroin abuse (HCC) 09/07/2013

## 2023-04-30 NOTE — ED Notes (Signed)
 Patient in the bedroom sleeping. NAD.  Respirations are even and unlabored. Will continue to monitor for safety.

## 2023-04-30 NOTE — ED Notes (Signed)
Pt unable to attend group due to symptoms of withdrawal.

## 2023-04-30 NOTE — ED Notes (Signed)
Patient is sleeping. Respirations equal and unlabored, skin warm and dry, NAD. No change in assessment or acuity. Routine safety checks conducted according to facility protocol. Will continue to monitor for safety.   

## 2023-04-30 NOTE — Group Note (Addendum)
Group Topic: Change and Accountability  Group Date: 04/30/2023 Start Time: 1930 End Time: 2000 Facilitators: Hilma Favors, RN  Department: [facility]   Name: David [name] Date of Birth: Aug 06, 1980  MR: [id number]    Level of Participation: Did not attend group pt was not feeling well, withdraw symptoms  Quality of Participation: n/a Interactions with others: n/a Mood/Affect: n/a Triggers (if applicable):  Cognition: n/a Progress: n/a Response:  Plan: patient will be encouraged to attend groups  Patients Problems:  Patient Active Problem List   Diagnosis Date Noted   Cellulitis of left upper extremity    Passive suicidal ideations    Chronic viral hepatitis B without delta agent and without coma (HCC)    Chronic hepatitis C without hepatic coma (HCC)    Staphylococcus aureus bacteremia with sepsis (HCC)    Sepsis (HCC) 06/05/2017   Pain and swelling of right ankle    Tobacco abuse    Pyogenic arthritis of ankle (HCC)    Polysubstance abuse (HCC) 12/06/2016   Synovitis of right ankle 12/06/2016   Heroin abuse (HCC) 09/07/2013

## 2023-05-01 DIAGNOSIS — Z5900 Homelessness unspecified: Secondary | ICD-10-CM | POA: Diagnosis not present

## 2023-05-01 DIAGNOSIS — F17200 Nicotine dependence, unspecified, uncomplicated: Secondary | ICD-10-CM | POA: Diagnosis not present

## 2023-05-01 DIAGNOSIS — F1123 Opioid dependence with withdrawal: Secondary | ICD-10-CM | POA: Diagnosis not present

## 2023-05-01 MED ORDER — TAB-A-VITE/IRON PO TABS: 1.0000 | ORAL_TABLET | Freq: Every day | ORAL | Status: AC

## 2023-05-01 MED ORDER — NICOTINE 21 MG/24HR TD PT24: 21.0000 mg | MEDICATED_PATCH | Freq: Every day | TRANSDERMAL | 0 refills | Status: AC | PRN

## 2023-05-01 MED ORDER — BUPRENORPHINE HCL-NALOXONE HCL 8-2 MG SL SUBL: 1.0000 | SUBLINGUAL_TABLET | Freq: Two times a day (BID) | SUBLINGUAL | Status: AC
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
## 2023-05-01 NOTE — ED Notes (Signed)
Patient was provided lunch.

## 2023-05-01 NOTE — ED Notes (Signed)
Patient woke up asking for Gatorade.

## 2023-05-01 NOTE — ED Notes (Signed)
Patient was provided breakfast

## 2023-05-01 NOTE — Discharge Instructions (Signed)
[facility] by 8:00am. Number for emergencies is [phone]. Patient reports he will coordinate with [facility] to get to the facility on the admit date. Patient will resume outpatient follow up treatment for MAT with [facility]: Roena Malady [phone].   [facility] [address], [place], [address] [phone] phone  New Patient Assessment/Therapy Walk-Ins:  Monday and Wednesday: 8 am until slots are full. Every 1st and 2nd Fridays of the month: 1 pm - 5 pm.  NO ASSESSMENT/THERAPY WALK-INS ON TUESDAYS OR THURSDAYS  New Patient Assessment/Medication Management Walk-Ins:  Monday - Friday:  8 am - 11 am.  For all walk-ins, we ask that you arrive by 7:30 am because patients will be seen in the order of arrival.  Availability is limited; therefore, you may not be seen on the same day that you walk-in.  Our goal is to serve and meet the needs of our community to the best of our ability.  SUBSTANCE USE TREATMENT for Medicaid and State Funded/IPRS  [facility] [address], [place], [address] [phone] phone NOTE: [facility] is no longer offering IOP services.  Serves those who are low-income or have no insurance.  [facility] [address], [place], [place], [address] [phone] phone [phone] fax NOTE: Does have Substance Abuse-Intensive Outpatient Program [facility]) as well as transitional housing if eligible.  [facility] [address] [place], [place], [address] [phone] phone [phone] fax  [facility] [phone] [place], [place], [address] [phone] phone [phone] fax  HALFWAY HOUSES:  [facility] [phone]  [employer].oxfordvacancies.com  12 STEP PROGRAMS:  Alcoholics Anonymous of [place] SoftwareChalet.be  Narcotics Anonymous of [place]  HitProtect.dk  Al-Anon of [place], [place] www.[place]alanon.org/find-meetings.html  Nar-Anon https://nar-anon.org/find-a-meetin  List of Residential placements:   [facility] in [place]: [phone]  [facility]: [phone]  [place], [place] [phone]: Male and male facility; 30-day program: (uninsured and Medicaid such as Laurena Bering, [place], [place], partners)  [facility]: [phone]; men and women's facility; 28 days; Can have Medicaid tailored plan Tour manager or Partners)  [facility]: [phone] Karoline Caldwell or Larita Fife; 28 day program; must be fully detox; tailored Medicaid or no insurance  [address] in [place], [place]; [phone]; 28 day all males program; no insurance accepted  [facility] in [place]: Gabriel Rung [phone] (no insurance or Medicaid only); 90 days; outpatient services but provide housing in apartments [place]  [facility] Admission: [phone]: Patient must complete phone screening for placement: [place], [place]; 6 month program; uninsured, Medicaid, and [employer].   [facility]: no insurance required; [phone]  [facility]: [phone]; Intake: Molly Maduro; Must fill out application online; Alecia Lemming Delay [phone] x [address] [facility] in [place], [place]: [phone]; Admissions Coordinators Mr. Maurine Minister or Barron Alvine; 90 day program.  [facility]: [place], [place] [phone]; Co-Ed 9 month to a year program; Online application; Men entry fee is $500 (6-60months);  [employer]: [address] [place], [place] [address]; no fee or insurance required; minimum of 2 years; Highly structured; work based; Intake Coordinator is Thayer Ohm [phone]  [facility] in [place], [place]: [phone]; Fax number is [phone]; website: www.[facility].org; Requires 3-6 page  autobiography; 2 year program (18 months and then 60month transitional housing); Admission fee is $300; no insurance needed; work Automotive engineer in [place], [place]: Research officer, trade union:  [employer] [phone]: They have a Men's Regenerations Program 6-60months. Free program; There is an initial $300 fee however, they are willing to work with patients regarding that. Application is online.  [facility]: Admissions [phone] Doran Heater ext [phone]; Any 7-90 day program is out of pocket; 12 month program is free of charge; there is a $275 entry fee; Patient is responsible for own transportation

## 2023-05-01 NOTE — Progress Notes (Signed)
Patient given PO PRN of vistaril 25mg , Robaxin 500mg , and naprosyn 500mg  for symptoms of withdrawal.

## 2023-05-01 NOTE — Discharge Planning (Signed)
Phone screening was complete with [facility] this morning. Per Admissions Coordinator Lahoma Rocker, the patient has been accepted however there will not be a bed available until May 08, 2023 and the patient would need to present by 8:00am. Patient was informed that he would need to pass a drug screening on the day of in order to admit. Patient expressed understanding and reported he would follow up with Kathlene November with [facility] to solidify plan.   LCSW, MD, and Resident spoke with patient about plan and informed him that [facility] would not be able to hold him until the 17th of October. Patient expressed understanding and stated he should be fine with discharging home with his uncle until he can admit into [facility].MD inquired if team could follow up with uncle to confirm and safety plan. Patient replied sure, however reports he does not have his number. Patient reports Kathlene November from [place] can transport him to get his medication and then take him to his uncle's house whom Kathlene November is familiar with. LCSW attempted to call Kathlene November to confirm, however received no answer. [facility] sent text stating he will follow up shortly regarding patient.   Outpatient resources have been provided in AVS for the patient's review. No other needs to report at this time.   Fernande Boyden, LCSW Clinical Social Worker [place] [facility] Ph: [phone]

## 2023-05-01 NOTE — ED Notes (Signed)
Received patient in bed with no pillow case on pillow and room disheveled.  Assisted patient with new linens, additional pillow and pillow cases and made patient comfortable.  Patient also given gatorade.  He stated he wanted to leave however when writer helped him think it through he agreed that leaving would not be in his best interest.  Patient stated he is achy and cold.  These are the only opiate withdrawal symptoms reported.  Extra blankets given and PRN's pending.  Will monitor.

## 2023-05-01 NOTE — Discharge Planning (Signed)
LCSW received update from Roena Malady at [facility] that [facility] will be coming to transport the patient to his destination on today. No other needs were reported by Kathlene November. Patient aware. LCSW to sign off. Please inform if LCSW needs arise prior to discharge.   Fernande Boyden, LCSW Clinical Social Worker [facility] Ph: [phone]

## 2023-05-01 NOTE — ED Notes (Signed)
Patient is seen sleeping in the bed with intermittent leg movement. NAD. Respirations are even and unlabored.
